# Patient Record
Sex: Male | Born: 1955 | Race: Black or African American | Hispanic: No | Marital: Single | Smoking: Former smoker
Health system: Southern US, Community
[De-identification: ages and names within clinical notes are randomized; demographics above are authoritative.]

## PROBLEM LIST (undated history)

## (undated) DIAGNOSIS — N189 Chronic kidney disease, unspecified: Secondary | ICD-10-CM

## (undated) DIAGNOSIS — H543 Unqualified visual loss, both eyes: Secondary | ICD-10-CM

## (undated) DIAGNOSIS — I10 Essential (primary) hypertension: Secondary | ICD-10-CM

## (undated) DIAGNOSIS — E1129 Type 2 diabetes mellitus with other diabetic kidney complication: Secondary | ICD-10-CM

## (undated) DIAGNOSIS — I150 Renovascular hypertension: Secondary | ICD-10-CM

## (undated) DIAGNOSIS — K922 Gastrointestinal hemorrhage, unspecified: Secondary | ICD-10-CM

## (undated) DIAGNOSIS — N289 Disorder of kidney and ureter, unspecified: Secondary | ICD-10-CM

## (undated) DIAGNOSIS — E785 Hyperlipidemia, unspecified: Secondary | ICD-10-CM

## (undated) DIAGNOSIS — D631 Anemia in chronic kidney disease: Secondary | ICD-10-CM

## (undated) DIAGNOSIS — K219 Gastro-esophageal reflux disease without esophagitis: Secondary | ICD-10-CM

## (undated) DIAGNOSIS — N039 Chronic nephritic syndrome with unspecified morphologic changes: Secondary | ICD-10-CM

## (undated) DIAGNOSIS — D649 Anemia, unspecified: Secondary | ICD-10-CM

## (undated) DIAGNOSIS — E7800 Pure hypercholesterolemia, unspecified: Secondary | ICD-10-CM

## (undated) HISTORY — DX: Disorders of magnesium metabolism, unspecified: E83.40

## (undated) HISTORY — DX: Chronic nephritic syndrome with unspecified morphologic changes: N03.9

## (undated) HISTORY — DX: Chronic kidney disease, unspecified: N18.9

## (undated) HISTORY — DX: Anemia in chronic kidney disease: D63.1

## (undated) HISTORY — DX: Pure hypercholesterolemia, unspecified: E78.00

## (undated) HISTORY — DX: Renovascular hypertension: I15.0

## (undated) HISTORY — DX: Type 2 diabetes mellitus with other diabetic kidney complication: E11.29

## (undated) HISTORY — DX: Gastro-esophageal reflux disease without esophagitis: K21.9

---

## 2004-01-25 ENCOUNTER — Ambulatory Visit: Payer: Self-pay | Admitting: Infectious Diseases

## 2004-01-25 ENCOUNTER — Inpatient Hospital Stay (HOSPITAL_COMMUNITY): Admission: EM | Admit: 2004-01-25 | Discharge: 2004-02-01 | Payer: Self-pay | Admitting: Emergency Medicine

## 2004-01-28 ENCOUNTER — Encounter (INDEPENDENT_AMBULATORY_CARE_PROVIDER_SITE_OTHER): Payer: Self-pay | Admitting: *Deleted

## 2004-04-03 ENCOUNTER — Encounter (INDEPENDENT_AMBULATORY_CARE_PROVIDER_SITE_OTHER): Payer: Self-pay | Admitting: *Deleted

## 2004-04-03 ENCOUNTER — Inpatient Hospital Stay (HOSPITAL_COMMUNITY): Admission: AD | Admit: 2004-04-03 | Discharge: 2004-04-05 | Payer: Self-pay | Admitting: Orthopedic Surgery

## 2004-07-06 ENCOUNTER — Emergency Department (HOSPITAL_COMMUNITY): Admission: EM | Admit: 2004-07-06 | Discharge: 2004-07-06 | Payer: Self-pay | Admitting: Emergency Medicine

## 2006-03-02 ENCOUNTER — Ambulatory Visit: Payer: Self-pay | Admitting: Hospitalist

## 2006-03-02 ENCOUNTER — Inpatient Hospital Stay (HOSPITAL_COMMUNITY): Admission: EM | Admit: 2006-03-02 | Discharge: 2006-03-15 | Payer: Self-pay | Admitting: Emergency Medicine

## 2006-03-04 ENCOUNTER — Encounter: Payer: Self-pay | Admitting: Vascular Surgery

## 2006-03-07 ENCOUNTER — Encounter (INDEPENDENT_AMBULATORY_CARE_PROVIDER_SITE_OTHER): Payer: Self-pay | Admitting: Specialist

## 2006-03-12 ENCOUNTER — Encounter: Payer: Self-pay | Admitting: Vascular Surgery

## 2006-06-07 ENCOUNTER — Ambulatory Visit: Payer: Self-pay | Admitting: Vascular Surgery

## 2006-06-18 ENCOUNTER — Encounter (INDEPENDENT_AMBULATORY_CARE_PROVIDER_SITE_OTHER): Payer: Self-pay | Admitting: Internal Medicine

## 2006-08-28 ENCOUNTER — Telehealth (INDEPENDENT_AMBULATORY_CARE_PROVIDER_SITE_OTHER): Payer: Self-pay | Admitting: *Deleted

## 2007-01-07 ENCOUNTER — Ambulatory Visit: Payer: Self-pay | Admitting: Vascular Surgery

## 2007-06-24 ENCOUNTER — Ambulatory Visit: Payer: Self-pay | Admitting: Vascular Surgery

## 2007-12-23 ENCOUNTER — Ambulatory Visit: Payer: Self-pay | Admitting: Vascular Surgery

## 2008-04-10 ENCOUNTER — Inpatient Hospital Stay (HOSPITAL_COMMUNITY): Admission: EM | Admit: 2008-04-10 | Discharge: 2008-04-13 | Payer: Self-pay | Admitting: Emergency Medicine

## 2008-06-22 ENCOUNTER — Ambulatory Visit: Payer: Self-pay | Admitting: Vascular Surgery

## 2008-07-09 ENCOUNTER — Emergency Department (HOSPITAL_COMMUNITY): Admission: EM | Admit: 2008-07-09 | Discharge: 2008-07-09 | Payer: Self-pay | Admitting: Emergency Medicine

## 2009-02-09 ENCOUNTER — Ambulatory Visit: Payer: Self-pay | Admitting: Vascular Surgery

## 2009-03-06 ENCOUNTER — Emergency Department (HOSPITAL_COMMUNITY): Admission: EM | Admit: 2009-03-06 | Discharge: 2009-03-06 | Payer: Self-pay | Admitting: Emergency Medicine

## 2009-04-10 ENCOUNTER — Inpatient Hospital Stay (HOSPITAL_COMMUNITY): Admission: EM | Admit: 2009-04-10 | Discharge: 2009-04-19 | Payer: Self-pay | Admitting: Emergency Medicine

## 2009-04-10 ENCOUNTER — Ambulatory Visit: Payer: Self-pay | Admitting: Pulmonary Disease

## 2009-04-11 ENCOUNTER — Ambulatory Visit: Payer: Self-pay | Admitting: Internal Medicine

## 2009-04-12 ENCOUNTER — Encounter (INDEPENDENT_AMBULATORY_CARE_PROVIDER_SITE_OTHER): Payer: Self-pay | Admitting: Internal Medicine

## 2009-04-12 ENCOUNTER — Encounter: Payer: Self-pay | Admitting: Internal Medicine

## 2009-07-03 ENCOUNTER — Inpatient Hospital Stay (HOSPITAL_COMMUNITY): Admission: EM | Admit: 2009-07-03 | Discharge: 2009-07-06 | Payer: Self-pay | Admitting: Emergency Medicine

## 2009-07-03 ENCOUNTER — Ambulatory Visit: Payer: Self-pay | Admitting: Infectious Diseases

## 2009-10-04 ENCOUNTER — Inpatient Hospital Stay (HOSPITAL_COMMUNITY): Admission: EM | Admit: 2009-10-04 | Discharge: 2009-10-13 | Payer: Self-pay | Admitting: Emergency Medicine

## 2009-10-06 ENCOUNTER — Encounter (INDEPENDENT_AMBULATORY_CARE_PROVIDER_SITE_OTHER): Payer: Self-pay | Admitting: Internal Medicine

## 2009-10-12 ENCOUNTER — Ambulatory Visit: Payer: Self-pay | Admitting: Internal Medicine

## 2009-11-24 ENCOUNTER — Ambulatory Visit: Payer: Self-pay | Admitting: Internal Medicine

## 2009-11-24 ENCOUNTER — Inpatient Hospital Stay (HOSPITAL_COMMUNITY): Admission: EM | Admit: 2009-11-24 | Discharge: 2009-11-30 | Payer: Self-pay | Admitting: Emergency Medicine

## 2010-01-09 ENCOUNTER — Encounter (HOSPITAL_BASED_OUTPATIENT_CLINIC_OR_DEPARTMENT_OTHER): Admission: RE | Admit: 2010-01-09 | Discharge: 2010-01-27 | Payer: Self-pay | Admitting: General Surgery

## 2010-01-30 ENCOUNTER — Inpatient Hospital Stay (HOSPITAL_COMMUNITY)
Admission: RE | Admit: 2010-01-30 | Discharge: 2010-02-01 | Payer: Self-pay | Source: Home / Self Care | Admitting: Orthopaedic Surgery

## 2010-01-30 ENCOUNTER — Encounter (INDEPENDENT_AMBULATORY_CARE_PROVIDER_SITE_OTHER): Payer: Self-pay | Admitting: Orthopaedic Surgery

## 2010-02-28 ENCOUNTER — Inpatient Hospital Stay (HOSPITAL_COMMUNITY): Admission: EM | Admit: 2010-02-28 | Discharge: 2010-03-08 | Payer: Self-pay | Source: Home / Self Care

## 2010-03-21 ENCOUNTER — Ambulatory Visit: Admit: 2010-03-21 | Payer: Self-pay | Admitting: Vascular Surgery

## 2010-03-21 ENCOUNTER — Ambulatory Visit
Admission: RE | Admit: 2010-03-21 | Discharge: 2010-03-21 | Payer: Self-pay | Source: Home / Self Care | Attending: Vascular Surgery | Admitting: Vascular Surgery

## 2010-04-11 NOTE — Procedures (Signed)
Summary: Upper Endoscopy  Patient: Louis English Note: All result statuses are Final unless otherwise noted.  Tests: (1) Upper Endoscopy (EGD)   EGD Upper Endoscopy       DONE     [facility]     [address]     [place], [place]  [address]           ENDOSCOPY PROCEDURE REPORT           PATIENT:  Louis, English  MR#:  [id number]     BIRTHDATE:  June 06, 1955, 53 yrs. old  GENDER:  male           ENDOSCOPIST:  Hedwig Morton. Juanda Chance, MD     Referred by:  Yetta Barre, M.D.     Coralyn Helling, M.D.           PROCEDURE DATE:  04/12/2009     PROCEDURE:  EGD with biopsy     ASA CLASS:  Class III     INDICATIONS:  nausea and vomiting, hematemesis Right pleural     effusion, diabetes with known gastroparesis           MEDICATIONS:   Versed 7.5 mg, Fentanyl 80 mcg     TOPICAL ANESTHETIC:  Cetacaine Spray           DESCRIPTION OF PROCEDURE:   After the risks benefits and     alternatives of the procedure were thoroughly explained, informed     consent was obtained.  The EG-[id number] ([id number]) endoscope was     introduced through the mouth and advanced to the second portion of     the duodenum, without limitations.  The instrument was slowly     withdrawn as the mucosa was fully examined.     <<PROCEDUREIMAGES>>           Esophagitis was found in the distal esophagus (see image006).     Grade 3 esophagitis, contact bleeding and erosions, no stricture     Mild gastritis was found. minimal prepyloris erythema, patent     pyloric outlet, no gastric retention A biopsy for H. pylori was     taken (see image004, image005, and image002).  The duodenal bulb     was normal in appearance, as was the postbulbar duodenum (see     image003).    Retroflexed views revealed no abnormalities.    The     scope was then withdrawn from the patient and the procedure     completed.           COMPLICATIONS:  None           ENDOSCOPIC IMPRESSION:     1) Esophagitis in the distal esophagus     2) Mild gastritis     3) Normal duodenum     hematemesis likely from esophagitis,     RECOMMENDATIONS:     1) await biopsy results     see chart for plan of treatment           REPEAT EXAM:  In 0 year(s) for.           ______________________________     Hedwig Morton. Juanda Chance, MD           CC:           n.     eSIGNED:   Hedwig Morton. Nygel Prokop at 04/12/2009 03:11 PM           Louis English, [id number]  Note: An exclamation mark Marland Kitchen)  indicates a result that was not dispersed into the flowsheet. Document Creation Date: 04/12/2009 3:12 PM _______________________________________________________________________  (1) Order result status: Final Collection or observation date-time: 04/12/2009 14:59 Requested date-time:  Receipt date-time:  Reported date-time:  Referring Physician:   Ordering Physician: Lina Sar [phone]) Specimen Source:  Source: Launa Grill Order Number: [id number] Lab site:

## 2010-05-03 IMAGING — US US ABDOMEN COMPLETE
1 series · 14 of 25 positions shown · non-contrast
Comparison: Renal ultrasound 04/09/2008

CLINICAL DATA: Nausea and vomiting/acute renal failure

COMPLETE ABDOMINAL ULTRASOUND

[Series 1: us abdomen complete · 0.32mm/px · 14 of 63 slices shown]
[im 1/63]
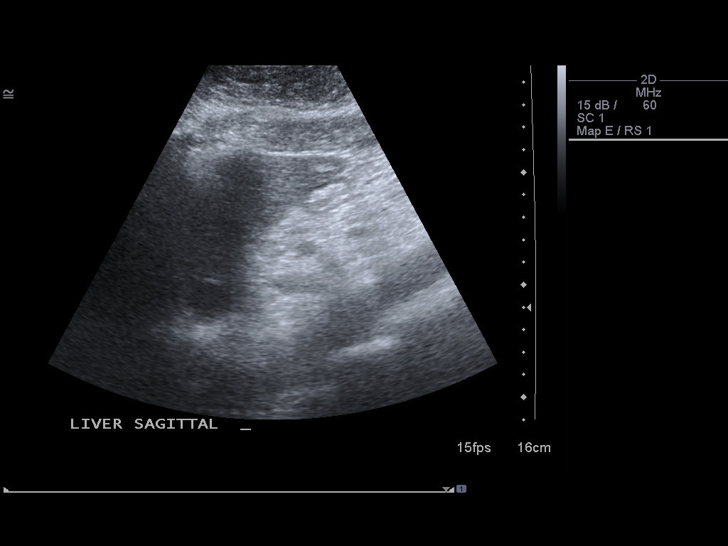
[im 6/63]
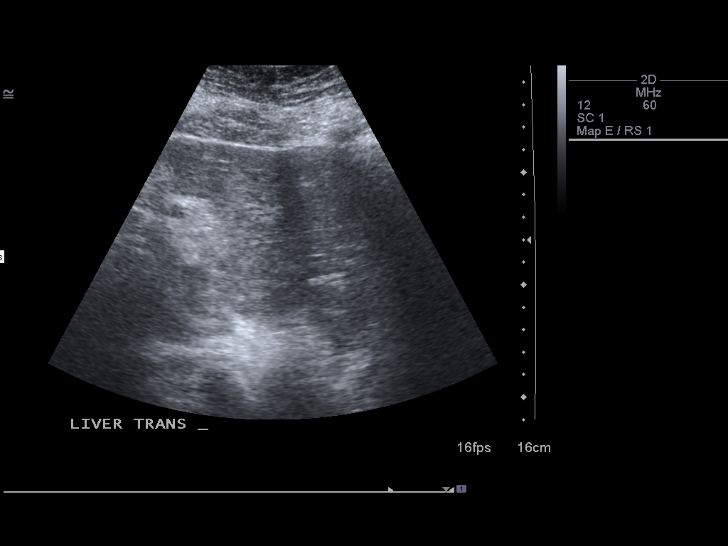
[im 11/63]
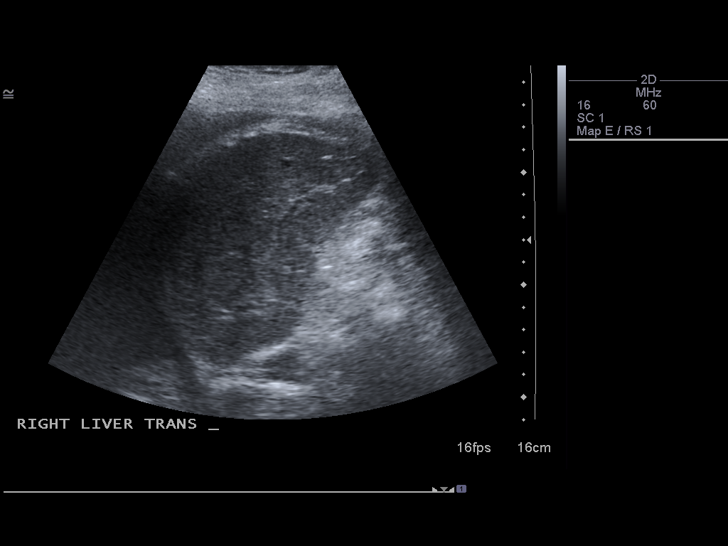
[im 16/63]
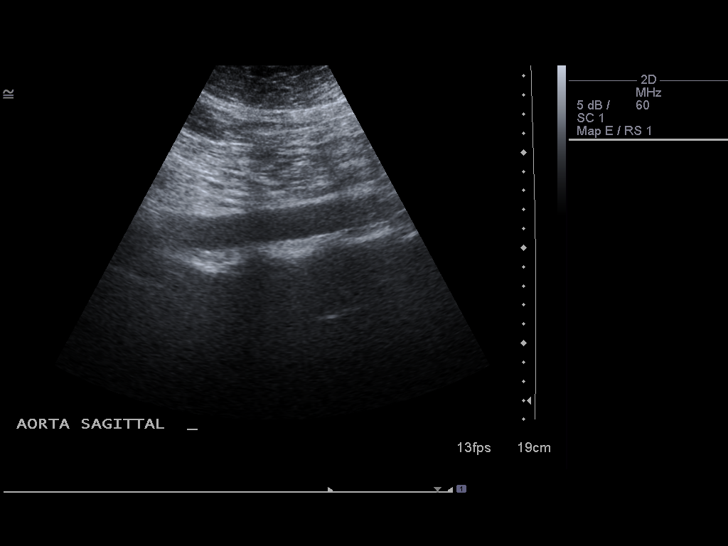
[im 21/63]
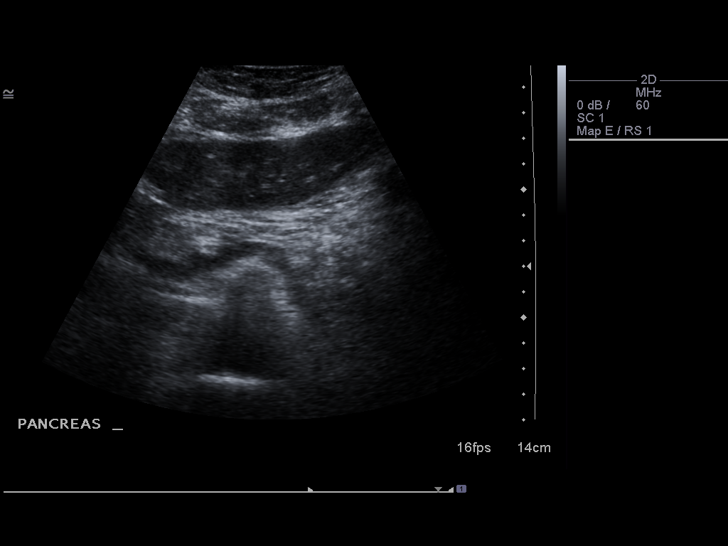
[im 24/63]
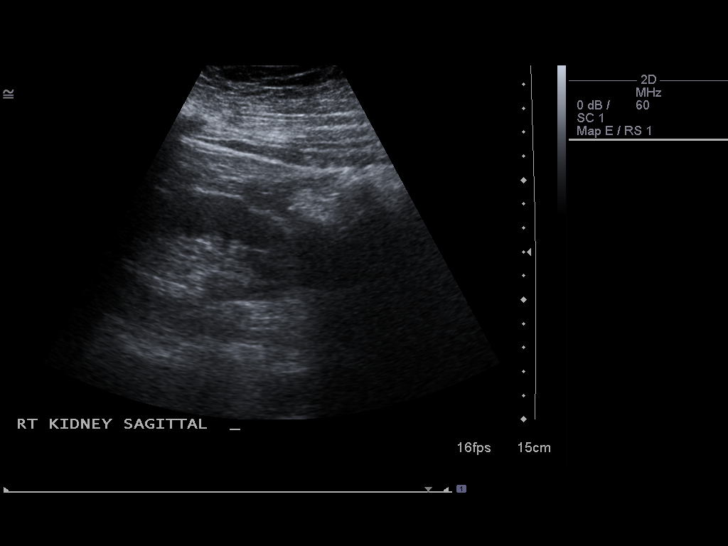
[im 29/63]
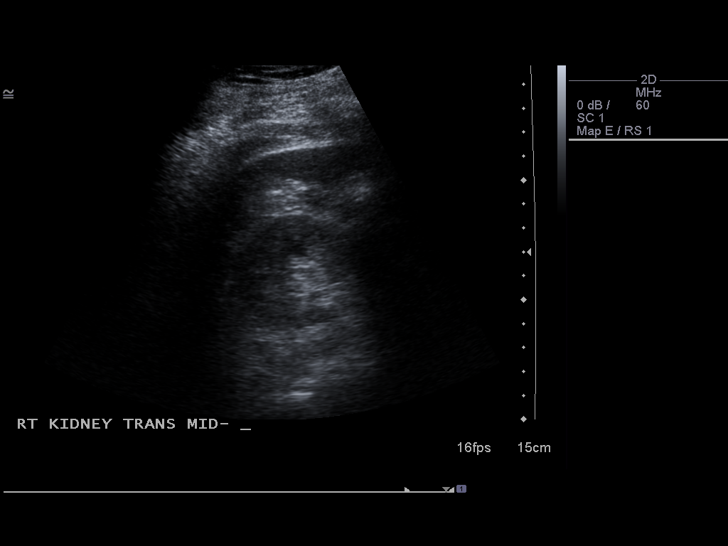
[im 34/63]
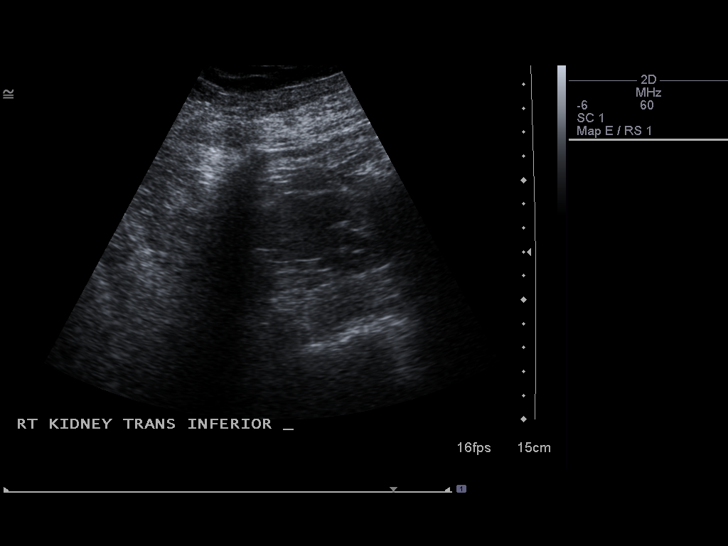
[im 39/63]
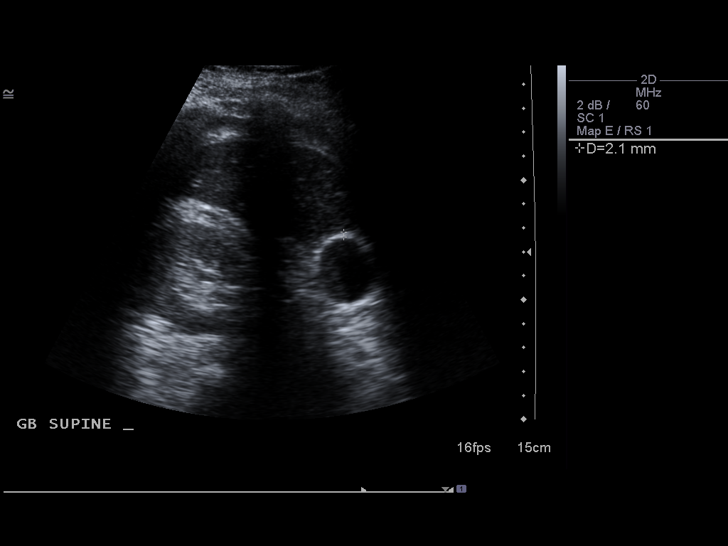
[im 42/63]
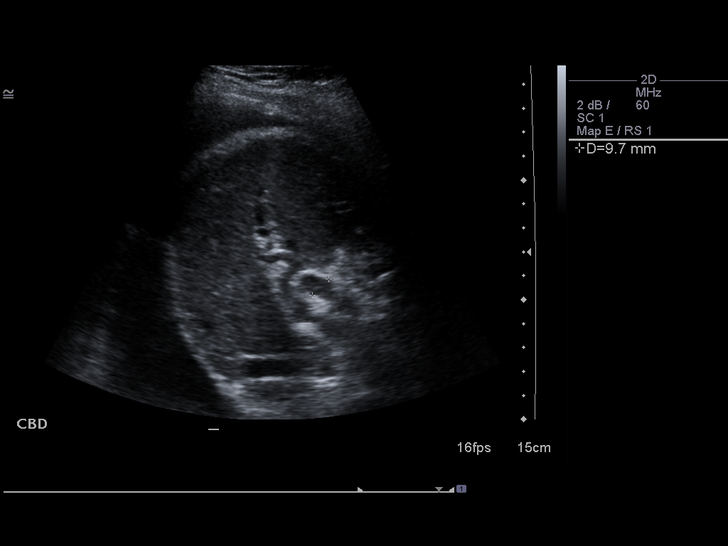
[im 47/63]
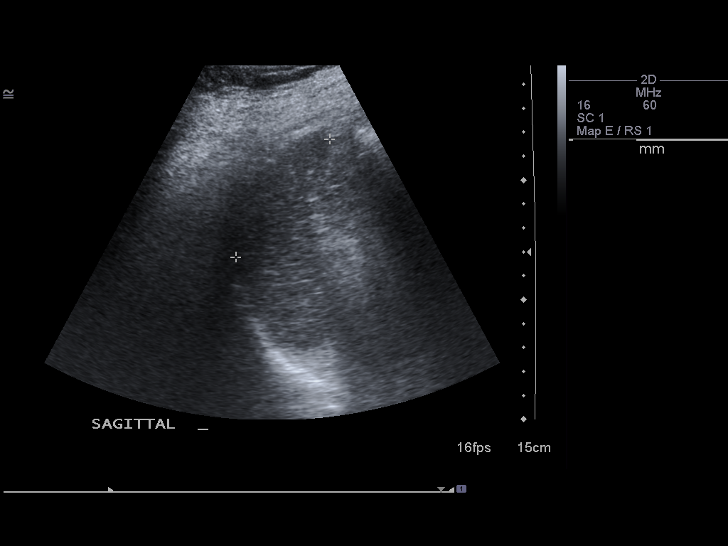
[im 52/63]
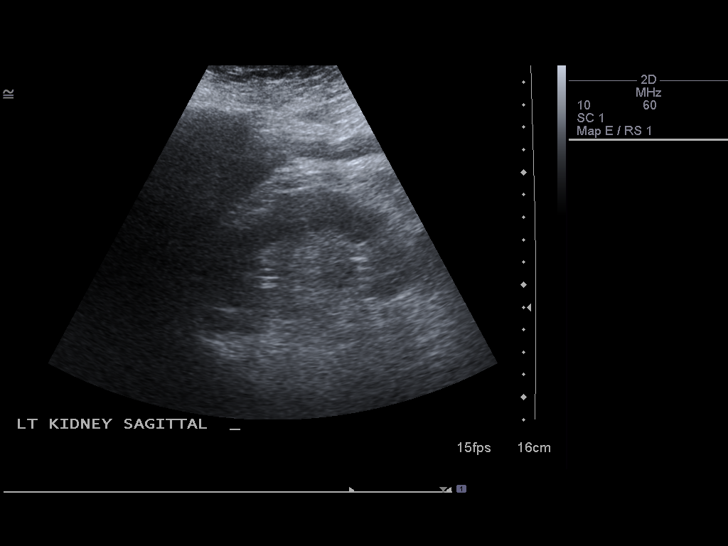
[im 57/63]
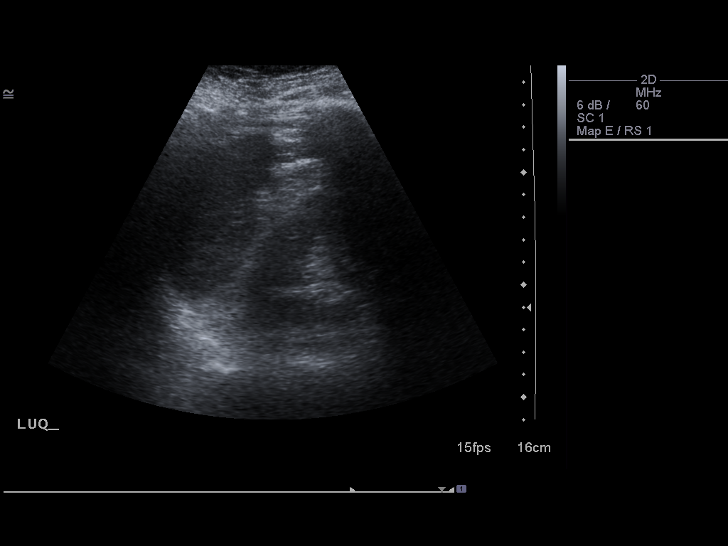
[im 63/63]
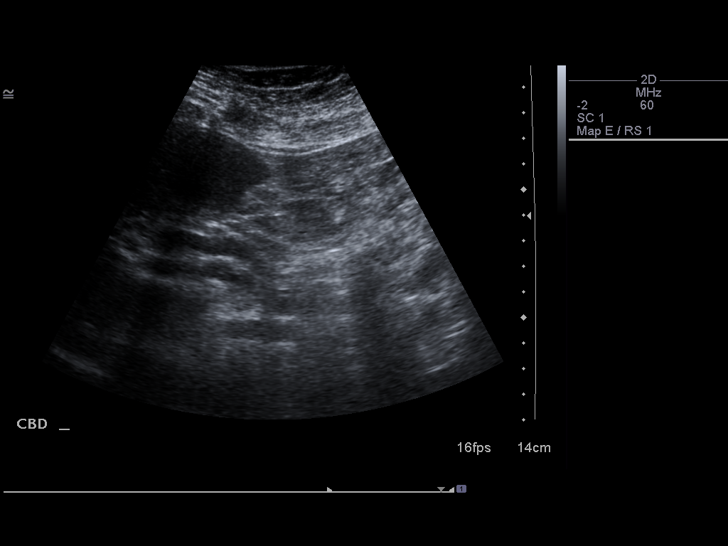

[14 of 25 positions shown; findings below may reference images not displayed]

FINDINGS: Gallbladder:  There is biliary sludge.  No definite stones, wall
thickening, or pericholecystic fluid.  This can be seen normally in
patients who have been fasting for a prolonged periods of time.

Common bile duct:  Dilated at to 1 cm.  No obvious intraluminal
stones or mass.

Liver:  No focal lesions.

IVC:  Appears normal.

Pancreas:  No focal abnormality seen.

Spleen:  6.3 cm.  No lesions

Right Kidney:  11.1 cm in length.  There are several small cysts.
No hydronephrosis.

Left Kidney:  10.8 cm in length.  No hydronephrosis or focal
lesions.

Abdominal aorta:  Normal

A right pleural effusion is incidentally noted.
IMPRESSION: 1.  Biliary sludge without stones.
2.  Common bile duct dilated without stones or obstructing lesions
identified.  This needs clinical correlation.
3.  Normal renal size without hydronephrosis.
4.  A right pleural effusion is incidentally noted.

## 2010-05-05 IMAGING — CR DG CHEST 2V
1 series · 1 of 1 positions shown · non-contrast
Comparison: 04/12/2009

CLINICAL DATA: Cough, shortness of breath, follow up effusion.

CHEST - 2 VIEW

[view not recorded]
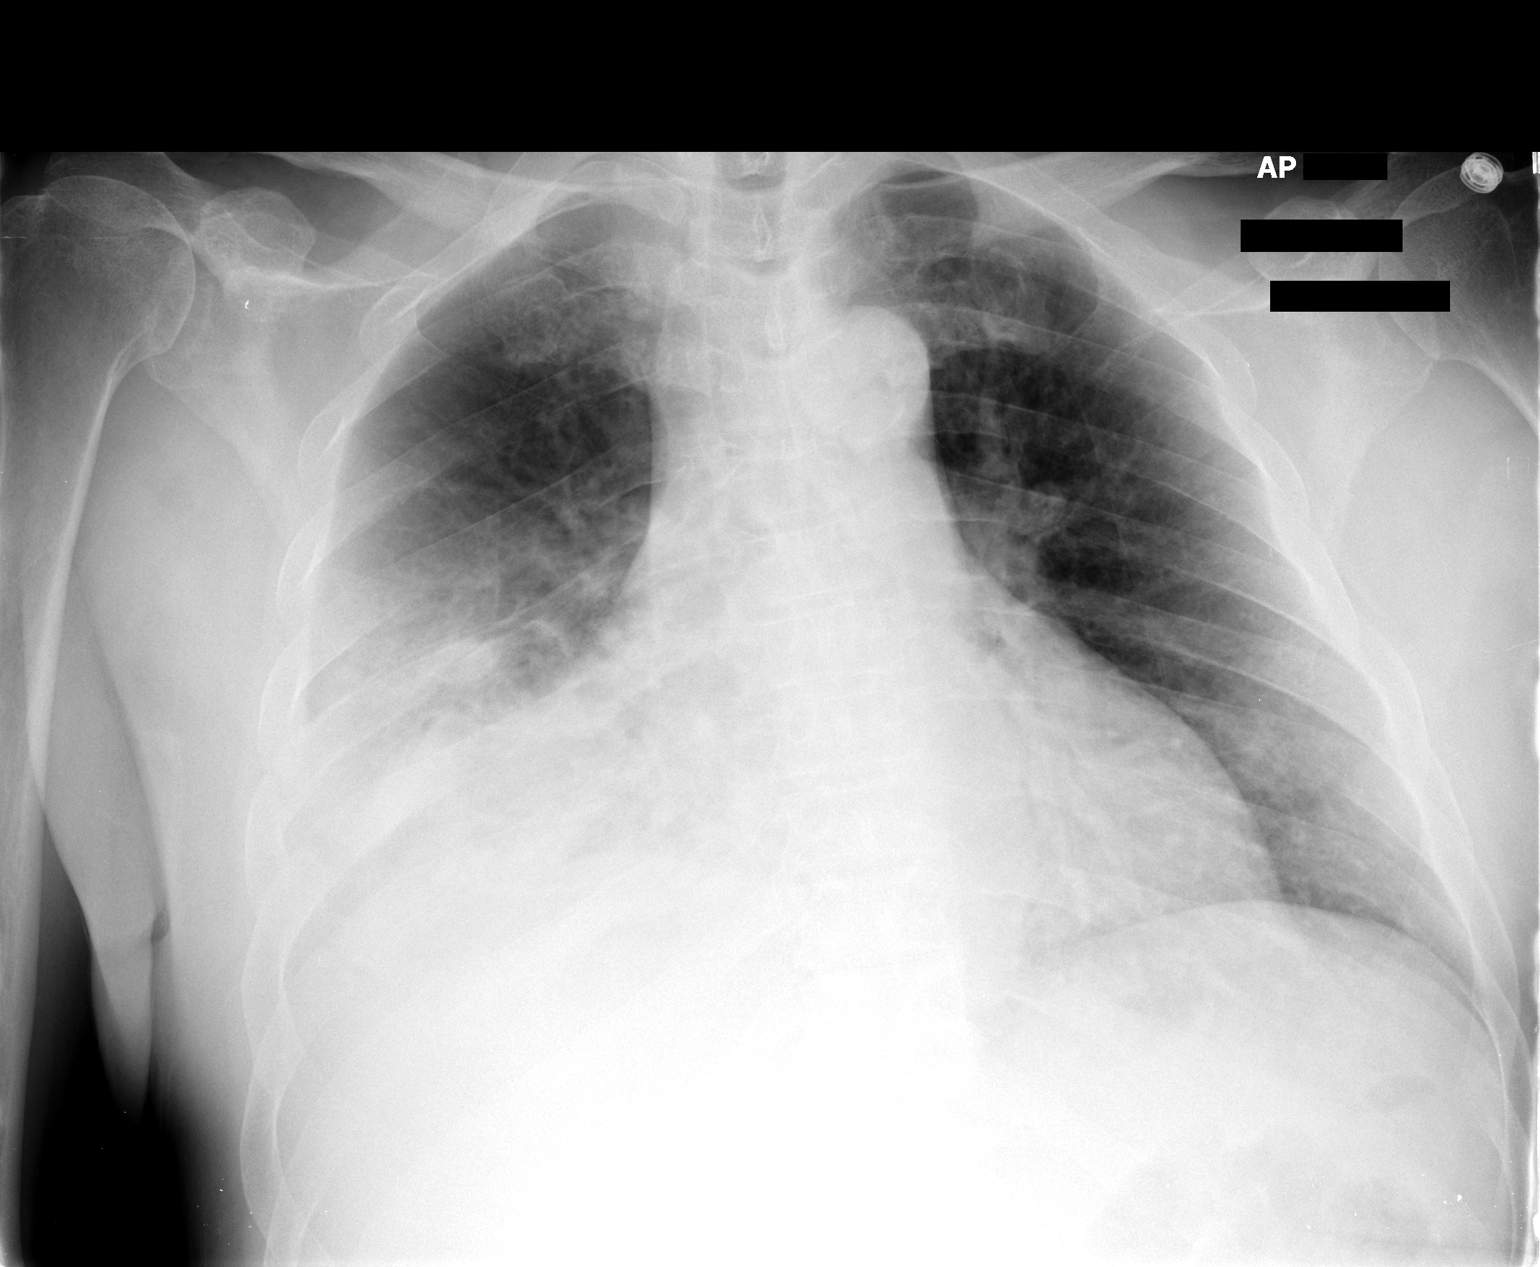

[1 of 1 positions shown; findings below may reference images not displayed]

FINDINGS: Trachea is midline.  Heart size stable.  Right pleural
effusion and right middle and right lower lobe air space disease
persist.  Minimal left lower lobe subsegmental atelectasis.
IMPRESSION: Right pleural effusion and right middle and right lower lobe air
space disease persist.  Findings may be due to pneumonia.  Follow-
up to clearing is recommended.

## 2010-05-05 IMAGING — CR DG CHEST 1V PORT
1 series · 1 of 1 positions shown · non-contrast
Comparison: 04/13/2009 at 1061 hours

CLINICAL DATA: Post right thoracentesis

PORTABLE CHEST - 1 VIEW

[AP]
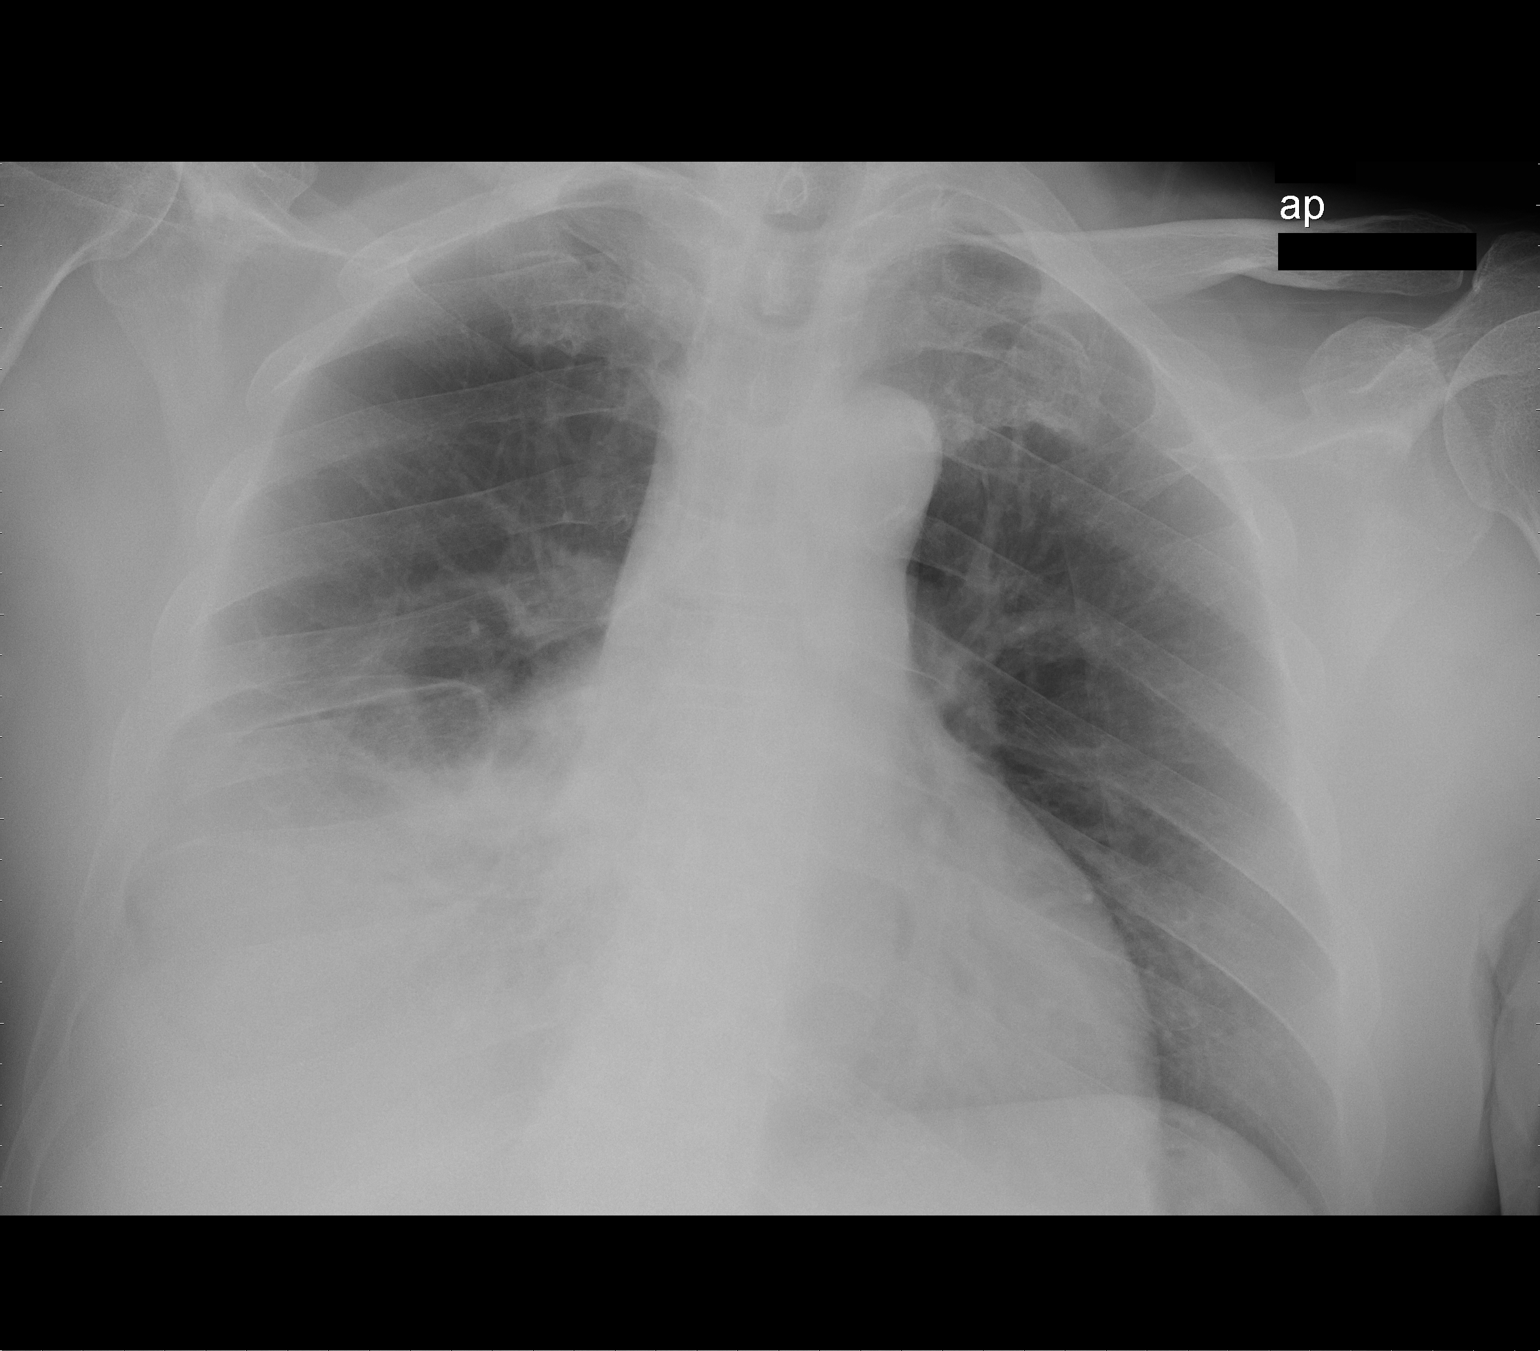

[1 of 1 positions shown; findings below may reference images not displayed]

FINDINGS: No pneumothorax following right thoracentesis.  The right
pleural effusion is somewhat smaller.
IMPRESSION: No pneumothorax following right thoracentesis.

## 2010-05-22 LAB — GLUCOSE, CAPILLARY
Glucose-Capillary: 100 mg/dL — ABNORMAL HIGH (ref 70–99)
Glucose-Capillary: 104 mg/dL — ABNORMAL HIGH (ref 70–99)
Glucose-Capillary: 109 mg/dL — ABNORMAL HIGH (ref 70–99)
Glucose-Capillary: 115 mg/dL — ABNORMAL HIGH (ref 70–99)
Glucose-Capillary: 120 mg/dL — ABNORMAL HIGH (ref 70–99)
Glucose-Capillary: 138 mg/dL — ABNORMAL HIGH (ref 70–99)
Glucose-Capillary: 146 mg/dL — ABNORMAL HIGH (ref 70–99)
Glucose-Capillary: 164 mg/dL — ABNORMAL HIGH (ref 70–99)
Glucose-Capillary: 170 mg/dL — ABNORMAL HIGH (ref 70–99)
Glucose-Capillary: 180 mg/dL — ABNORMAL HIGH (ref 70–99)
Glucose-Capillary: 180 mg/dL — ABNORMAL HIGH (ref 70–99)
Glucose-Capillary: 184 mg/dL — ABNORMAL HIGH (ref 70–99)
Glucose-Capillary: 192 mg/dL — ABNORMAL HIGH (ref 70–99)
Glucose-Capillary: 195 mg/dL — ABNORMAL HIGH (ref 70–99)
Glucose-Capillary: 196 mg/dL — ABNORMAL HIGH (ref 70–99)
Glucose-Capillary: 210 mg/dL — ABNORMAL HIGH (ref 70–99)
Glucose-Capillary: 215 mg/dL — ABNORMAL HIGH (ref 70–99)
Glucose-Capillary: 223 mg/dL — ABNORMAL HIGH (ref 70–99)
Glucose-Capillary: 264 mg/dL — ABNORMAL HIGH (ref 70–99)
Glucose-Capillary: 291 mg/dL — ABNORMAL HIGH (ref 70–99)
Glucose-Capillary: 98 mg/dL (ref 70–99)

## 2010-05-22 LAB — DIFFERENTIAL
Basophils Absolute: 0 10*3/uL (ref 0.0–0.1)
Basophils Relative: 0 % (ref 0–1)
Eosinophils Absolute: 0 10*3/uL (ref 0.0–0.7)
Eosinophils Absolute: 0 10*3/uL (ref 0.0–0.7)
Eosinophils Relative: 0 % (ref 0–5)
Lymphocytes Relative: 13 % (ref 12–46)
Lymphs Abs: 3.1 10*3/uL (ref 0.7–4.0)
Monocytes Relative: 1 % — ABNORMAL LOW (ref 3–12)
Neutro Abs: 14.7 10*3/uL — ABNORMAL HIGH (ref 1.7–7.7)
Neutrophils Relative %: 82 % — ABNORMAL HIGH (ref 43–77)
Neutrophils Relative %: 91 % — ABNORMAL HIGH (ref 43–77)

## 2010-05-22 LAB — CBC
HCT: 26.7 % — ABNORMAL LOW (ref 39.0–52.0)
HCT: 28.8 % — ABNORMAL LOW (ref 39.0–52.0)
HCT: 29.5 % — ABNORMAL LOW (ref 39.0–52.0)
HCT: 31 % — ABNORMAL LOW (ref 39.0–52.0)
Hemoglobin: 8.7 g/dL — ABNORMAL LOW (ref 13.0–17.0)
Hemoglobin: 8.7 g/dL — ABNORMAL LOW (ref 13.0–17.0)
Hemoglobin: 8.9 g/dL — ABNORMAL LOW (ref 13.0–17.0)
Hemoglobin: 9.3 g/dL — ABNORMAL LOW (ref 13.0–17.0)
MCH: 26.4 pg (ref 26.0–34.0)
MCH: 26.7 pg (ref 26.0–34.0)
MCH: 26.7 pg (ref 26.0–34.0)
MCH: 26.9 pg (ref 26.0–34.0)
MCH: 26.9 pg (ref 26.0–34.0)
MCHC: 32.3 g/dL (ref 30.0–36.0)
MCHC: 32.4 g/dL (ref 30.0–36.0)
MCHC: 32.5 g/dL (ref 30.0–36.0)
MCHC: 32.7 g/dL (ref 30.0–36.0)
MCHC: 32.7 g/dL (ref 30.0–36.0)
MCHC: 32.9 g/dL (ref 30.0–36.0)
MCV: 82.4 fL (ref 78.0–100.0)
MCV: 82.7 fL (ref 78.0–100.0)
MCV: 83.3 fL (ref 78.0–100.0)
Platelets: 268 10*3/uL (ref 150–400)
Platelets: 273 10*3/uL (ref 150–400)
Platelets: 305 10*3/uL (ref 150–400)
Platelets: 325 10*3/uL (ref 150–400)
Platelets: 354 10*3/uL (ref 150–400)
Platelets: 383 10*3/uL (ref 150–400)
RBC: 3.23 MIL/uL — ABNORMAL LOW (ref 4.22–5.81)
RBC: 3.48 MIL/uL — ABNORMAL LOW (ref 4.22–5.81)
RBC: 3.73 MIL/uL — ABNORMAL LOW (ref 4.22–5.81)
RBC: 3.75 MIL/uL — ABNORMAL LOW (ref 4.22–5.81)
RDW: 18.4 % — ABNORMAL HIGH (ref 11.5–15.5)
RDW: 18.5 % — ABNORMAL HIGH (ref 11.5–15.5)
RDW: 18.5 % — ABNORMAL HIGH (ref 11.5–15.5)
RDW: 18.5 % — ABNORMAL HIGH (ref 11.5–15.5)
WBC: 5.2 10*3/uL (ref 4.0–10.5)
WBC: 5.4 10*3/uL (ref 4.0–10.5)

## 2010-05-22 LAB — BASIC METABOLIC PANEL
BUN: 26 mg/dL — ABNORMAL HIGH (ref 6–23)
BUN: 30 mg/dL — ABNORMAL HIGH (ref 6–23)
BUN: 31 mg/dL — ABNORMAL HIGH (ref 6–23)
BUN: 38 mg/dL — ABNORMAL HIGH (ref 6–23)
BUN: 41 mg/dL — ABNORMAL HIGH (ref 6–23)
CO2: 24 mEq/L (ref 19–32)
CO2: 26 mEq/L (ref 19–32)
CO2: 27 mEq/L (ref 19–32)
CO2: 27 mEq/L (ref 19–32)
CO2: 29 mEq/L (ref 19–32)
Calcium: 8.7 mg/dL (ref 8.4–10.5)
Calcium: 9 mg/dL (ref 8.4–10.5)
Calcium: 9.1 mg/dL (ref 8.4–10.5)
Calcium: 9.1 mg/dL (ref 8.4–10.5)
Calcium: 9.1 mg/dL (ref 8.4–10.5)
Chloride: 102 mEq/L (ref 96–112)
Chloride: 106 mEq/L (ref 96–112)
Chloride: 106 mEq/L (ref 96–112)
Chloride: 107 mEq/L (ref 96–112)
Creatinine, Ser: 0.93 mg/dL (ref 0.4–1.5)
Creatinine, Ser: 0.99 mg/dL (ref 0.4–1.5)
Creatinine, Ser: 1.02 mg/dL (ref 0.4–1.5)
Creatinine, Ser: 1.09 mg/dL (ref 0.4–1.5)
Creatinine, Ser: 1.36 mg/dL (ref 0.4–1.5)
GFR calc Af Amer: >60 mL/min (ref >60)
GFR calc Af Amer: >60 mL/min (ref >60)
GFR calc Af Amer: >60 mL/min (ref >60)
GFR calc Af Amer: >60 mL/min (ref >60)
GFR calc Af Amer: >60 mL/min (ref >60)
GFR calc Af Amer: >60 mL/min (ref >60)
GFR calc non Af Amer: >60 mL/min (ref >60)
GFR calc non Af Amer: >60 mL/min (ref >60)
GFR calc non Af Amer: >60 mL/min (ref >60)
GFR calc non Af Amer: >60 mL/min (ref >60)
GFR calc non Af Amer: >60 mL/min (ref >60)
Glucose, Bld: 106 mg/dL — ABNORMAL HIGH (ref 70–99)
Glucose, Bld: 122 mg/dL — ABNORMAL HIGH (ref 70–99)
Glucose, Bld: 192 mg/dL — ABNORMAL HIGH (ref 70–99)
Glucose, Bld: 199 mg/dL — ABNORMAL HIGH (ref 70–99)
Glucose, Bld: 209 mg/dL — ABNORMAL HIGH (ref 70–99)
Potassium: 3.9 mEq/L (ref 3.5–5.1)
Potassium: 4 mEq/L (ref 3.5–5.1)
Potassium: 4.3 mEq/L (ref 3.5–5.1)
Potassium: 4.7 mEq/L (ref 3.5–5.1)
Sodium: 136 mEq/L (ref 135–145)
Sodium: 136 mEq/L (ref 135–145)
Sodium: 137 mEq/L (ref 135–145)
Sodium: 137 mEq/L (ref 135–145)

## 2010-05-22 LAB — TSH: TSH: 1.54 u[IU]/mL (ref 0.350–4.500)

## 2010-05-22 LAB — HEMOGLOBIN A1C
Hgb A1c MFr Bld: 6.8 % — ABNORMAL HIGH (ref <5.7)
Mean Plasma Glucose: 148 mg/dL — ABNORMAL HIGH (ref <117)

## 2010-05-22 LAB — URINE CULTURE: Colony Count: >=100000

## 2010-05-22 LAB — URINALYSIS, ROUTINE W REFLEX MICROSCOPIC
Bilirubin Urine: NEGATIVE
Glucose, UA: NEGATIVE mg/dL
Ketones, ur: NEGATIVE mg/dL
Specific Gravity, Urine: 1.015 (ref 1.005–1.030)
Urobilinogen, UA: 0.2 mg/dL (ref 0.0–1.0)
pH: 7 (ref 5.0–8.0)

## 2010-05-22 LAB — MRSA PCR SCREENING: MRSA by PCR: NEGATIVE

## 2010-05-22 LAB — IRON AND TIBC
Saturation Ratios: 24 % (ref 20–55)
UIBC: 182 ug/dL

## 2010-05-22 LAB — URINE MICROSCOPIC-ADD ON

## 2010-05-22 LAB — VITAMIN B12: Vitamin B-12: 705 pg/mL (ref 211–911)

## 2010-05-23 LAB — BASIC METABOLIC PANEL
BUN: 42 mg/dL — ABNORMAL HIGH (ref 6–23)
CO2: 27 mEq/L (ref 19–32)
Calcium: 8 mg/dL — ABNORMAL LOW (ref 8.4–10.5)
Calcium: 9 mg/dL (ref 8.4–10.5)
Chloride: 98 mEq/L (ref 96–112)
Creatinine, Ser: 1.21 mg/dL (ref 0.4–1.5)
Creatinine, Ser: 1.61 mg/dL — ABNORMAL HIGH (ref 0.4–1.5)
Creatinine, Ser: 1.92 mg/dL — ABNORMAL HIGH (ref 0.4–1.5)
GFR calc Af Amer: 54 mL/min — ABNORMAL LOW (ref >60)
GFR calc Af Amer: >60 mL/min (ref >60)
GFR calc non Af Amer: 37 mL/min — ABNORMAL LOW (ref >60)
GFR calc non Af Amer: >60 mL/min (ref >60)
Glucose, Bld: 140 mg/dL — ABNORMAL HIGH (ref 70–99)
Potassium: 4.5 mEq/L (ref 3.5–5.1)
Sodium: 134 mEq/L — ABNORMAL LOW (ref 135–145)

## 2010-05-23 LAB — CBC
HCT: 21.1 % — ABNORMAL LOW (ref 39.0–52.0)
HCT: 26.6 % — ABNORMAL LOW (ref 39.0–52.0)
Hemoglobin: 9 g/dL — ABNORMAL LOW (ref 13.0–17.0)
MCH: 27.3 pg (ref 26.0–34.0)
MCH: 27.9 pg (ref 26.0–34.0)
MCHC: 33.2 g/dL (ref 30.0–36.0)
MCHC: 33.5 g/dL (ref 30.0–36.0)
MCV: 82.4 fL (ref 78.0–100.0)
Platelets: 324 10*3/uL (ref 150–400)
Platelets: 361 10*3/uL (ref 150–400)
Platelets: 375 10*3/uL (ref 150–400)
Platelets: 436 10*3/uL — ABNORMAL HIGH (ref 150–400)
RBC: 3 MIL/uL — ABNORMAL LOW (ref 4.22–5.81)
RBC: 3.23 MIL/uL — ABNORMAL LOW (ref 4.22–5.81)
RBC: 3.23 MIL/uL — ABNORMAL LOW (ref 4.22–5.81)
RDW: 15.2 % (ref 11.5–15.5)
WBC: 11 10*3/uL — ABNORMAL HIGH (ref 4.0–10.5)
WBC: 15.4 10*3/uL — ABNORMAL HIGH (ref 4.0–10.5)

## 2010-05-23 LAB — DIFFERENTIAL
Basophils Absolute: 0 10*3/uL (ref 0.0–0.1)
Eosinophils Absolute: 0 10*3/uL (ref 0.0–0.7)
Lymphocytes Relative: 3 % — ABNORMAL LOW (ref 12–46)
Neutro Abs: 28.8 10*3/uL — ABNORMAL HIGH (ref 1.7–7.7)
Neutrophils Relative %: 92 % — ABNORMAL HIGH (ref 43–77)

## 2010-05-23 LAB — GLUCOSE, CAPILLARY
Glucose-Capillary: 134 mg/dL — ABNORMAL HIGH (ref 70–99)
Glucose-Capillary: 155 mg/dL — ABNORMAL HIGH (ref 70–99)
Glucose-Capillary: 69 mg/dL — ABNORMAL LOW (ref 70–99)
Glucose-Capillary: 73 mg/dL (ref 70–99)
Glucose-Capillary: 79 mg/dL (ref 70–99)

## 2010-05-23 LAB — CROSSMATCH
ABO/RH(D): O NEG
Unit division: 0

## 2010-05-23 LAB — COMPREHENSIVE METABOLIC PANEL
AST: 16 U/L (ref 0–37)
Albumin: 1.6 g/dL — ABNORMAL LOW (ref 3.5–5.2)
Calcium: 8.3 mg/dL — ABNORMAL LOW (ref 8.4–10.5)
Creatinine, Ser: 1.78 mg/dL — ABNORMAL HIGH (ref 0.4–1.5)
GFR calc Af Amer: 48 mL/min — ABNORMAL LOW (ref >60)

## 2010-05-23 LAB — PROTIME-INR: INR: 1.4 (ref 0.00–1.49)

## 2010-05-25 LAB — CBC
Hemoglobin: 7.6 g/dL — ABNORMAL LOW (ref 13.0–17.0)
Hemoglobin: 8.2 g/dL — ABNORMAL LOW (ref 13.0–17.0)
MCH: 25.5 pg — ABNORMAL LOW (ref 26.0–34.0)
MCH: 25.9 pg — ABNORMAL LOW (ref 26.0–34.0)
MCHC: 33.9 g/dL (ref 30.0–36.0)
MCHC: 34.5 g/dL (ref 30.0–36.0)
MCHC: 34.5 g/dL (ref 30.0–36.0)
MCV: 75.5 fL — ABNORMAL LOW (ref 78.0–100.0)
Platelets: 379 10*3/uL (ref 150–400)
Platelets: 387 10*3/uL (ref 150–400)
Platelets: 419 10*3/uL — ABNORMAL HIGH (ref 150–400)
RBC: 2.82 MIL/uL — ABNORMAL LOW (ref 4.22–5.81)
RBC: 2.96 MIL/uL — ABNORMAL LOW (ref 4.22–5.81)
RBC: 3.36 MIL/uL — ABNORMAL LOW (ref 4.22–5.81)
RDW: 15.3 % (ref 11.5–15.5)
RDW: 15.3 % (ref 11.5–15.5)
RDW: 15.5 % (ref 11.5–15.5)
RDW: 15.6 % — ABNORMAL HIGH (ref 11.5–15.5)
WBC: 11 10*3/uL — ABNORMAL HIGH (ref 4.0–10.5)
WBC: 6.6 10*3/uL (ref 4.0–10.5)
WBC: 9.5 10*3/uL (ref 4.0–10.5)

## 2010-05-25 LAB — BASIC METABOLIC PANEL
BUN: 2 mg/dL — ABNORMAL LOW (ref 6–23)
BUN: 2 mg/dL — ABNORMAL LOW (ref 6–23)
BUN: 3 mg/dL — ABNORMAL LOW (ref 6–23)
BUN: 4 mg/dL — ABNORMAL LOW (ref 6–23)
BUN: 5 mg/dL — ABNORMAL LOW (ref 6–23)
CO2: 23 mEq/L (ref 19–32)
CO2: 25 mEq/L (ref 19–32)
CO2: 26 mEq/L (ref 19–32)
Calcium: 7.9 mg/dL — ABNORMAL LOW (ref 8.4–10.5)
Calcium: 7.9 mg/dL — ABNORMAL LOW (ref 8.4–10.5)
Calcium: 8 mg/dL — ABNORMAL LOW (ref 8.4–10.5)
Calcium: 8 mg/dL — ABNORMAL LOW (ref 8.4–10.5)
Chloride: 103 mEq/L (ref 96–112)
Chloride: 103 mEq/L (ref 96–112)
Creatinine, Ser: 0.98 mg/dL (ref 0.4–1.5)
GFR calc Af Amer: >60 mL/min (ref >60)
GFR calc Af Amer: >60 mL/min (ref >60)
GFR calc non Af Amer: >60 mL/min (ref >60)
GFR calc non Af Amer: >60 mL/min (ref >60)
GFR calc non Af Amer: >60 mL/min (ref >60)
Glucose, Bld: 110 mg/dL — ABNORMAL HIGH (ref 70–99)
Glucose, Bld: 135 mg/dL — ABNORMAL HIGH (ref 70–99)
Glucose, Bld: 190 mg/dL — ABNORMAL HIGH (ref 70–99)
Glucose, Bld: 191 mg/dL — ABNORMAL HIGH (ref 70–99)
Potassium: 3.8 mEq/L (ref 3.5–5.1)
Potassium: 4 mEq/L (ref 3.5–5.1)
Sodium: 132 mEq/L — ABNORMAL LOW (ref 135–145)
Sodium: 134 mEq/L — ABNORMAL LOW (ref 135–145)
Sodium: 135 mEq/L (ref 135–145)
Sodium: 138 mEq/L (ref 135–145)

## 2010-05-25 LAB — URINE MICROSCOPIC-ADD ON

## 2010-05-25 LAB — GLUCOSE, CAPILLARY
Glucose-Capillary: 111 mg/dL — ABNORMAL HIGH (ref 70–99)
Glucose-Capillary: 112 mg/dL — ABNORMAL HIGH (ref 70–99)
Glucose-Capillary: 134 mg/dL — ABNORMAL HIGH (ref 70–99)
Glucose-Capillary: 136 mg/dL — ABNORMAL HIGH (ref 70–99)
Glucose-Capillary: 149 mg/dL — ABNORMAL HIGH (ref 70–99)
Glucose-Capillary: 151 mg/dL — ABNORMAL HIGH (ref 70–99)
Glucose-Capillary: 153 mg/dL — ABNORMAL HIGH (ref 70–99)
Glucose-Capillary: 155 mg/dL — ABNORMAL HIGH (ref 70–99)
Glucose-Capillary: 168 mg/dL — ABNORMAL HIGH (ref 70–99)
Glucose-Capillary: 183 mg/dL — ABNORMAL HIGH (ref 70–99)
Glucose-Capillary: 184 mg/dL — ABNORMAL HIGH (ref 70–99)
Glucose-Capillary: 195 mg/dL — ABNORMAL HIGH (ref 70–99)
Glucose-Capillary: 202 mg/dL — ABNORMAL HIGH (ref 70–99)
Glucose-Capillary: 213 mg/dL — ABNORMAL HIGH (ref 70–99)
Glucose-Capillary: 258 mg/dL — ABNORMAL HIGH (ref 70–99)
Glucose-Capillary: 267 mg/dL — ABNORMAL HIGH (ref 70–99)
Glucose-Capillary: 314 mg/dL — ABNORMAL HIGH (ref 70–99)
Glucose-Capillary: 98 mg/dL (ref 70–99)

## 2010-05-25 LAB — TYPE AND SCREEN

## 2010-05-25 LAB — COMPREHENSIVE METABOLIC PANEL
AST: 15 U/L (ref 0–37)
Albumin: 2.3 g/dL — ABNORMAL LOW (ref 3.5–5.2)
Alkaline Phosphatase: 73 U/L (ref 39–117)
BUN: 9 mg/dL (ref 6–23)
Chloride: 104 mEq/L (ref 96–112)
GFR calc Af Amer: >60 mL/min (ref >60)
Potassium: 2.9 mEq/L — ABNORMAL LOW (ref 3.5–5.1)
Sodium: 136 mEq/L (ref 135–145)
Total Bilirubin: 0.6 mg/dL (ref 0.3–1.2)
Total Protein: 7.1 g/dL (ref 6.0–8.3)

## 2010-05-25 LAB — IRON AND TIBC
Iron: 12 ug/dL — ABNORMAL LOW (ref 42–135)
Saturation Ratios: 12 % — ABNORMAL LOW (ref 20–55)
TIBC: 104 ug/dL — ABNORMAL LOW (ref 215–435)

## 2010-05-25 LAB — RETICULOCYTES: Retic Count, Absolute: 30.3 10*3/uL (ref 19.0–186.0)

## 2010-05-25 LAB — DIFFERENTIAL
Basophils Absolute: 0 10*3/uL (ref 0.0–0.1)
Basophils Relative: 0 % (ref 0–1)
Eosinophils Relative: 1 % (ref 0–5)
Monocytes Absolute: 0.9 10*3/uL (ref 0.1–1.0)
Monocytes Relative: 10 % (ref 3–12)
Neutro Abs: 7.1 10*3/uL (ref 1.7–7.7)

## 2010-05-25 LAB — SEDIMENTATION RATE: Sed Rate: 123 mm/hr — ABNORMAL HIGH (ref 0–16)

## 2010-05-25 LAB — CLOSTRIDIUM DIFFICILE EIA

## 2010-05-25 LAB — URINALYSIS, ROUTINE W REFLEX MICROSCOPIC
Glucose, UA: NEGATIVE mg/dL
Protein, ur: 100 mg/dL — AB
Specific Gravity, Urine: 1.022 (ref 1.005–1.030)

## 2010-05-25 LAB — LIPID PANEL
Cholesterol: 106 mg/dL (ref 0–200)
Total CHOL/HDL Ratio: 3.8 RATIO
VLDL: 14 mg/dL (ref 0–40)

## 2010-05-25 LAB — MAGNESIUM: Magnesium: 1.3 mg/dL — ABNORMAL LOW (ref 1.5–2.5)

## 2010-05-25 LAB — HEMOCCULT GUIAC POC 1CARD (OFFICE): Fecal Occult Bld: POSITIVE

## 2010-05-25 LAB — VITAMIN B12: Vitamin B-12: 555 pg/mL (ref 211–911)

## 2010-05-25 LAB — URINE CULTURE

## 2010-05-25 LAB — MRSA PCR SCREENING: MRSA by PCR: POSITIVE — AB

## 2010-05-26 LAB — BASIC METABOLIC PANEL
BUN: 12 mg/dL (ref 6–23)
CO2: 28 mEq/L (ref 19–32)
CO2: 28 mEq/L (ref 19–32)
Calcium: 8 mg/dL — ABNORMAL LOW (ref 8.4–10.5)
Calcium: 8 mg/dL — ABNORMAL LOW (ref 8.4–10.5)
Chloride: 102 mEq/L (ref 96–112)
Chloride: 107 mEq/L (ref 96–112)
Creatinine, Ser: 1.54 mg/dL — ABNORMAL HIGH (ref 0.4–1.5)
Creatinine, Ser: 1.6 mg/dL — ABNORMAL HIGH (ref 0.4–1.5)
GFR calc Af Amer: 57 mL/min — ABNORMAL LOW (ref >60)
GFR calc non Af Amer: 46 mL/min — ABNORMAL LOW (ref >60)
GFR calc non Af Amer: 47 mL/min — ABNORMAL LOW (ref >60)
Glucose, Bld: 313 mg/dL — ABNORMAL HIGH (ref 70–99)
Potassium: 3.7 mEq/L (ref 3.5–5.1)
Sodium: 139 mEq/L (ref 135–145)
Sodium: 140 mEq/L (ref 135–145)

## 2010-05-26 LAB — GLUCOSE, CAPILLARY
Glucose-Capillary: 100 mg/dL — ABNORMAL HIGH (ref 70–99)
Glucose-Capillary: 162 mg/dL — ABNORMAL HIGH (ref 70–99)
Glucose-Capillary: 188 mg/dL — ABNORMAL HIGH (ref 70–99)
Glucose-Capillary: 188 mg/dL — ABNORMAL HIGH (ref 70–99)
Glucose-Capillary: 201 mg/dL — ABNORMAL HIGH (ref 70–99)
Glucose-Capillary: 214 mg/dL — ABNORMAL HIGH (ref 70–99)
Glucose-Capillary: 277 mg/dL — ABNORMAL HIGH (ref 70–99)
Glucose-Capillary: 54 mg/dL — ABNORMAL LOW (ref 70–99)

## 2010-05-26 LAB — CBC
HCT: 27.2 % — ABNORMAL LOW (ref 39.0–52.0)
Hemoglobin: 8.8 g/dL — ABNORMAL LOW (ref 13.0–17.0)
Hemoglobin: 9.1 g/dL — ABNORMAL LOW (ref 13.0–17.0)
MCH: 27 pg (ref 26.0–34.0)
MCH: 27.2 pg (ref 26.0–34.0)
MCHC: 32.5 g/dL (ref 30.0–36.0)
MCHC: 34.5 g/dL (ref 30.0–36.0)
MCHC: 34.7 g/dL (ref 30.0–36.0)
MCV: 78.1 fL (ref 78.0–100.0)
Platelets: 304 10*3/uL (ref 150–400)
Platelets: 349 10*3/uL (ref 150–400)
RBC: 3.34 MIL/uL — ABNORMAL LOW (ref 4.22–5.81)
RDW: 13.4 % (ref 11.5–15.5)

## 2010-05-26 LAB — CLOSTRIDIUM DIFFICILE EIA
C difficile Toxins A+B, EIA: NEGATIVE
C difficile Toxins A+B, EIA: NEGATIVE
C difficile Toxins A+B, EIA: NEGATIVE

## 2010-05-26 LAB — HEMOCCULT GUIAC POC 1CARD (OFFICE): Fecal Occult Bld: NEGATIVE

## 2010-05-26 LAB — URIC ACID: Uric Acid, Serum: 3.8 mg/dL — ABNORMAL LOW (ref 4.0–7.8)

## 2010-05-26 LAB — HEMOGLOBIN A1C: Mean Plasma Glucose: 157 mg/dL — ABNORMAL HIGH (ref <117)

## 2010-05-27 LAB — URINE MICROSCOPIC-ADD ON

## 2010-05-27 LAB — GLUCOSE, CAPILLARY
Glucose-Capillary: 109 mg/dL — ABNORMAL HIGH (ref 70–99)
Glucose-Capillary: 113 mg/dL — ABNORMAL HIGH (ref 70–99)
Glucose-Capillary: 117 mg/dL — ABNORMAL HIGH (ref 70–99)
Glucose-Capillary: 120 mg/dL — ABNORMAL HIGH (ref 70–99)
Glucose-Capillary: 149 mg/dL — ABNORMAL HIGH (ref 70–99)
Glucose-Capillary: 179 mg/dL — ABNORMAL HIGH (ref 70–99)
Glucose-Capillary: 184 mg/dL — ABNORMAL HIGH (ref 70–99)
Glucose-Capillary: 234 mg/dL — ABNORMAL HIGH (ref 70–99)
Glucose-Capillary: 245 mg/dL — ABNORMAL HIGH (ref 70–99)
Glucose-Capillary: 257 mg/dL — ABNORMAL HIGH (ref 70–99)
Glucose-Capillary: 259 mg/dL — ABNORMAL HIGH (ref 70–99)
Glucose-Capillary: 286 mg/dL — ABNORMAL HIGH (ref 70–99)

## 2010-05-27 LAB — CORTISOL: Cortisol, Plasma: 40.2 ug/dL

## 2010-05-27 LAB — URINALYSIS, ROUTINE W REFLEX MICROSCOPIC
Leukocytes, UA: NEGATIVE
Nitrite: NEGATIVE
Specific Gravity, Urine: 1.023 (ref 1.005–1.030)
Urobilinogen, UA: 1 mg/dL (ref 0.0–1.0)
pH: 5 (ref 5.0–8.0)

## 2010-05-27 LAB — CBC
HCT: 27.4 % — ABNORMAL LOW (ref 39.0–52.0)
HCT: 27.5 % — ABNORMAL LOW (ref 39.0–52.0)
HCT: 28.4 % — ABNORMAL LOW (ref 39.0–52.0)
HCT: 29.9 % — ABNORMAL LOW (ref 39.0–52.0)
HCT: 33 % — ABNORMAL LOW (ref 39.0–52.0)
HCT: 35.2 % — ABNORMAL LOW (ref 39.0–52.0)
Hemoglobin: 11.6 g/dL — ABNORMAL LOW (ref 13.0–17.0)
Hemoglobin: 9.4 g/dL — ABNORMAL LOW (ref 13.0–17.0)
Hemoglobin: 9.9 g/dL — ABNORMAL LOW (ref 13.0–17.0)
MCH: 27.9 pg (ref 26.0–34.0)
MCH: 28.2 pg (ref 26.0–34.0)
MCHC: 32.7 g/dL (ref 30.0–36.0)
MCHC: 32.9 g/dL (ref 30.0–36.0)
MCHC: 33.2 g/dL (ref 30.0–36.0)
MCV: 84.2 fL (ref 78.0–100.0)
MCV: 84.7 fL (ref 78.0–100.0)
MCV: 85 fL (ref 78.0–100.0)
MCV: 85.1 fL (ref 78.0–100.0)
MCV: 85.2 fL (ref 78.0–100.0)
Platelets: 142 10*3/uL — ABNORMAL LOW (ref 150–400)
Platelets: 158 10*3/uL (ref 150–400)
RBC: 3.22 MIL/uL — ABNORMAL LOW (ref 4.22–5.81)
RBC: 3.53 MIL/uL — ABNORMAL LOW (ref 4.22–5.81)
RDW: 14.2 % (ref 11.5–15.5)
RDW: 14.2 % (ref 11.5–15.5)
RDW: 14.3 % (ref 11.5–15.5)
WBC: 14.4 10*3/uL — ABNORMAL HIGH (ref 4.0–10.5)
WBC: 16 10*3/uL — ABNORMAL HIGH (ref 4.0–10.5)
WBC: 23.2 10*3/uL — ABNORMAL HIGH (ref 4.0–10.5)
WBC: 23.6 10*3/uL — ABNORMAL HIGH (ref 4.0–10.5)
WBC: 24.3 10*3/uL — ABNORMAL HIGH (ref 4.0–10.5)

## 2010-05-27 LAB — DIFFERENTIAL
Basophils Absolute: 0 10*3/uL (ref 0.0–0.1)
Basophils Relative: 0 % (ref 0–1)
Basophils Relative: 0 % (ref 0–1)
Eosinophils Absolute: 0 10*3/uL (ref 0.0–0.7)
Lymphocytes Relative: 5 % — ABNORMAL LOW (ref 12–46)
Lymphocytes Relative: 6 % — ABNORMAL LOW (ref 12–46)
Lymphs Abs: 1.2 10*3/uL (ref 0.7–4.0)
Monocytes Relative: 5 % (ref 3–12)
Neutro Abs: 20.8 10*3/uL — ABNORMAL HIGH (ref 1.7–7.7)
Neutrophils Relative %: 84 % — ABNORMAL HIGH (ref 43–77)

## 2010-05-27 LAB — COMPREHENSIVE METABOLIC PANEL
ALT: 38 U/L (ref 0–53)
ALT: 40 U/L (ref 0–53)
AST: 43 U/L — ABNORMAL HIGH (ref 0–37)
Albumin: 2.2 g/dL — ABNORMAL LOW (ref 3.5–5.2)
Alkaline Phosphatase: 67 U/L (ref 39–117)
Alkaline Phosphatase: 84 U/L (ref 39–117)
Alkaline Phosphatase: 89 U/L (ref 39–117)
BUN: 25 mg/dL — ABNORMAL HIGH (ref 6–23)
BUN: 26 mg/dL — ABNORMAL HIGH (ref 6–23)
BUN: 29 mg/dL — ABNORMAL HIGH (ref 6–23)
CO2: 26 mEq/L (ref 19–32)
Calcium: 7.7 mg/dL — ABNORMAL LOW (ref 8.4–10.5)
Chloride: 102 mEq/L (ref 96–112)
GFR calc Af Amer: 58 mL/min — ABNORMAL LOW (ref >60)
GFR calc non Af Amer: 42 mL/min — ABNORMAL LOW (ref >60)
Glucose, Bld: 138 mg/dL — ABNORMAL HIGH (ref 70–99)
Glucose, Bld: 143 mg/dL — ABNORMAL HIGH (ref 70–99)
Glucose, Bld: 151 mg/dL — ABNORMAL HIGH (ref 70–99)
Potassium: 3.1 mEq/L — ABNORMAL LOW (ref 3.5–5.1)
Potassium: 3.2 mEq/L — ABNORMAL LOW (ref 3.5–5.1)
Potassium: 3.5 mEq/L (ref 3.5–5.1)
Sodium: 140 mEq/L (ref 135–145)
Total Bilirubin: 1.2 mg/dL (ref 0.3–1.2)
Total Protein: 5.8 g/dL — ABNORMAL LOW (ref 6.0–8.3)
Total Protein: 6.3 g/dL (ref 6.0–8.3)
Total Protein: 7 g/dL (ref 6.0–8.3)

## 2010-05-27 LAB — HEMOCCULT GUIAC POC 1CARD (OFFICE): Fecal Occult Bld: NEGATIVE

## 2010-05-27 LAB — CSF CELL COUNT WITH DIFFERENTIAL: Tube #: 1

## 2010-05-27 LAB — LACTATE DEHYDROGENASE, PLEURAL OR PERITONEAL FLUID: LD, Fluid: 12 U/L (ref 3–23)

## 2010-05-27 LAB — CULTURE, BLOOD (ROUTINE X 2)
Culture: NO GROWTH
Culture: NO GROWTH

## 2010-05-27 LAB — CARDIAC PANEL(CRET KIN+CKTOT+MB+TROPI)
Relative Index: 1.1 (ref 0.0–2.5)
Total CK: 127 U/L (ref 7–232)
Total CK: 152 U/L (ref 7–232)
Troponin I: 0.04 ng/mL (ref 0.00–0.06)
Troponin I: 0.08 ng/mL — ABNORMAL HIGH (ref 0.00–0.06)

## 2010-05-27 LAB — HERPES SIMPLEX VIRUS(HSV) DNA BY PCR: HSV 1 DNA: NOT DETECTED

## 2010-05-27 LAB — URINE CULTURE: Culture: NO GROWTH

## 2010-05-27 LAB — ALBUMIN, FLUID (OTHER): Albumin, Fluid: <2 g/dL

## 2010-05-27 LAB — BASIC METABOLIC PANEL
Chloride: 107 mEq/L (ref 96–112)
GFR calc Af Amer: >60 mL/min (ref >60)
GFR calc non Af Amer: 50 mL/min — ABNORMAL LOW (ref >60)
Potassium: 3.2 mEq/L — ABNORMAL LOW (ref 3.5–5.1)

## 2010-05-27 LAB — CSF CULTURE W GRAM STAIN: Culture: NO GROWTH

## 2010-05-27 LAB — GLUCOSE, CSF: Glucose, CSF: 67 mg/dL (ref 43–76)

## 2010-05-27 LAB — SEDIMENTATION RATE: Sed Rate: 46 mm/hr — ABNORMAL HIGH (ref 0–16)

## 2010-05-27 LAB — PROTEIN, CSF: Total  Protein, CSF: 37 mg/dL (ref 15–45)

## 2010-05-27 LAB — POCT I-STAT, CHEM 8
Chloride: 100 mEq/L (ref 96–112)
Creatinine, Ser: 2.1 mg/dL — ABNORMAL HIGH (ref 0.4–1.5)
Glucose, Bld: 214 mg/dL — ABNORMAL HIGH (ref 70–99)
Potassium: 3.2 mEq/L — ABNORMAL LOW (ref 3.5–5.1)

## 2010-05-27 LAB — LACTIC ACID, PLASMA: Lactic Acid, Venous: 2 mmol/L (ref 0.5–2.2)

## 2010-05-28 LAB — CBC
HCT: 34.9 % — ABNORMAL LOW (ref 39.0–52.0)
Hemoglobin: 11 g/dL — ABNORMAL LOW (ref 13.0–17.0)
Hemoglobin: 11.3 g/dL — ABNORMAL LOW (ref 13.0–17.0)
Hemoglobin: 11.4 g/dL — ABNORMAL LOW (ref 13.0–17.0)
MCHC: 32.2 g/dL (ref 30.0–36.0)
MCHC: 32.2 g/dL (ref 30.0–36.0)
MCHC: 32.5 g/dL (ref 30.0–36.0)
MCHC: 32.8 g/dL (ref 30.0–36.0)
MCV: 83.9 fL (ref 78.0–100.0)
MCV: 84 fL (ref 78.0–100.0)
Platelets: 331 10*3/uL (ref 150–400)
RBC: 4.05 MIL/uL — ABNORMAL LOW (ref 4.22–5.81)
RBC: 4.25 MIL/uL (ref 4.22–5.81)
RDW: 15 % (ref 11.5–15.5)
RDW: 15.2 % (ref 11.5–15.5)
WBC: 7.5 10*3/uL (ref 4.0–10.5)

## 2010-05-28 LAB — GLUCOSE, CAPILLARY
Glucose-Capillary: 135 mg/dL — ABNORMAL HIGH (ref 70–99)
Glucose-Capillary: 181 mg/dL — ABNORMAL HIGH (ref 70–99)
Glucose-Capillary: 183 mg/dL — ABNORMAL HIGH (ref 70–99)

## 2010-05-28 LAB — DIFFERENTIAL
Eosinophils Absolute: 0.1 10*3/uL (ref 0.0–0.7)
Eosinophils Relative: 1 % (ref 0–5)
Lymphocytes Relative: 21 % (ref 12–46)
Lymphs Abs: 1.6 10*3/uL (ref 0.7–4.0)
Monocytes Absolute: 1 10*3/uL (ref 0.1–1.0)
Monocytes Relative: 13 % — ABNORMAL HIGH (ref 3–12)

## 2010-05-28 LAB — GASTRIC OCCULT BLOOD (1-CARD TO LAB)

## 2010-05-28 LAB — URINALYSIS, ROUTINE W REFLEX MICROSCOPIC
Bilirubin Urine: NEGATIVE
Glucose, UA: NEGATIVE mg/dL
Hgb urine dipstick: NEGATIVE
Protein, ur: NEGATIVE mg/dL
Specific Gravity, Urine: 1.019 (ref 1.005–1.030)
Urobilinogen, UA: 1 mg/dL (ref 0.0–1.0)

## 2010-05-28 LAB — COMPREHENSIVE METABOLIC PANEL
ALT: 19 U/L (ref 0–53)
AST: 22 U/L (ref 0–37)
Albumin: 3.1 g/dL — ABNORMAL LOW (ref 3.5–5.2)
CO2: 26 mEq/L (ref 19–32)
Calcium: 9.2 mg/dL (ref 8.4–10.5)
Creatinine, Ser: 3.23 mg/dL — ABNORMAL HIGH (ref 0.4–1.5)
GFR calc Af Amer: 24 mL/min — ABNORMAL LOW (ref >60)
GFR calc non Af Amer: 20 mL/min — ABNORMAL LOW (ref >60)
Sodium: 137 mEq/L (ref 135–145)

## 2010-05-28 LAB — BASIC METABOLIC PANEL
BUN: 65 mg/dL — ABNORMAL HIGH (ref 6–23)
BUN: 66 mg/dL — ABNORMAL HIGH (ref 6–23)
CO2: 27 mEq/L (ref 19–32)
CO2: 29 mEq/L (ref 19–32)
Chloride: 100 mEq/L (ref 96–112)
Chloride: 101 mEq/L (ref 96–112)
Chloride: 104 mEq/L (ref 96–112)
Creatinine, Ser: 2.92 mg/dL — ABNORMAL HIGH (ref 0.4–1.5)
GFR calc Af Amer: 29 mL/min — ABNORMAL LOW (ref >60)
GFR calc non Af Amer: 24 mL/min — ABNORMAL LOW (ref >60)
GFR calc non Af Amer: 29 mL/min — ABNORMAL LOW (ref >60)
Glucose, Bld: 176 mg/dL — ABNORMAL HIGH (ref 70–99)
Glucose, Bld: 221 mg/dL — ABNORMAL HIGH (ref 70–99)
Potassium: 4.1 mEq/L (ref 3.5–5.1)
Potassium: 4.2 mEq/L (ref 3.5–5.1)
Potassium: 4.6 mEq/L (ref 3.5–5.1)
Sodium: 144 mEq/L (ref 135–145)

## 2010-05-28 LAB — URINE CULTURE: Colony Count: 5000

## 2010-05-28 LAB — LIPID PANEL
HDL: 28 mg/dL — ABNORMAL LOW (ref >39)
Triglycerides: 102 mg/dL (ref <150)
VLDL: 20 mg/dL (ref 0–40)

## 2010-05-28 LAB — SODIUM, URINE, RANDOM: Sodium, Ur: 47 mEq/L

## 2010-05-28 LAB — CREATININE, URINE, RANDOM: Creatinine, Urine: 122.1 mg/dL

## 2010-05-28 LAB — BRAIN NATRIURETIC PEPTIDE: Pro B Natriuretic peptide (BNP): 43 pg/mL (ref 0.0–100.0)

## 2010-05-30 LAB — BASIC METABOLIC PANEL
BUN: 15 mg/dL (ref 6–23)
BUN: 19 mg/dL (ref 6–23)
CO2: 29 mEq/L (ref 19–32)
Calcium: 9.5 mg/dL (ref 8.4–10.5)
Chloride: 101 mEq/L (ref 96–112)
Creatinine, Ser: 1.32 mg/dL (ref 0.4–1.5)
Creatinine, Ser: 1.32 mg/dL (ref 0.4–1.5)
Creatinine, Ser: 1.47 mg/dL (ref 0.4–1.5)
GFR calc Af Amer: >60 mL/min (ref >60)
GFR calc Af Amer: >60 mL/min (ref >60)
GFR calc non Af Amer: 57 mL/min — ABNORMAL LOW (ref >60)
Glucose, Bld: 134 mg/dL — ABNORMAL HIGH (ref 70–99)
Potassium: 4.2 mEq/L (ref 3.5–5.1)
Potassium: 4.3 mEq/L (ref 3.5–5.1)
Sodium: 134 mEq/L — ABNORMAL LOW (ref 135–145)

## 2010-05-30 LAB — URINALYSIS, ROUTINE W REFLEX MICROSCOPIC
Glucose, UA: NEGATIVE mg/dL
Hgb urine dipstick: NEGATIVE
Ketones, ur: NEGATIVE mg/dL
Protein, ur: NEGATIVE mg/dL
pH: 5.5 (ref 5.0–8.0)

## 2010-05-30 LAB — FOLATE: Folate: >20 ng/mL

## 2010-05-30 LAB — DIFFERENTIAL
Basophils Absolute: 0 10*3/uL (ref 0.0–0.1)
Basophils Relative: 1 % (ref 0–1)
Neutro Abs: 1.7 10*3/uL (ref 1.7–7.7)
Neutrophils Relative %: 47 % (ref 43–77)

## 2010-05-30 LAB — GLUCOSE, CAPILLARY
Glucose-Capillary: 158 mg/dL — ABNORMAL HIGH (ref 70–99)
Glucose-Capillary: 186 mg/dL — ABNORMAL HIGH (ref 70–99)
Glucose-Capillary: 190 mg/dL — ABNORMAL HIGH (ref 70–99)
Glucose-Capillary: 222 mg/dL — ABNORMAL HIGH (ref 70–99)
Glucose-Capillary: 243 mg/dL — ABNORMAL HIGH (ref 70–99)
Glucose-Capillary: 254 mg/dL — ABNORMAL HIGH (ref 70–99)
Glucose-Capillary: 267 mg/dL — ABNORMAL HIGH (ref 70–99)
Glucose-Capillary: 267 mg/dL — ABNORMAL HIGH (ref 70–99)
Glucose-Capillary: 273 mg/dL — ABNORMAL HIGH (ref 70–99)

## 2010-05-30 LAB — CK TOTAL AND CKMB (NOT AT ARMC)
CK, MB: 1.4 ng/mL (ref 0.3–4.0)
Total CK: 126 U/L (ref 7–232)

## 2010-05-30 LAB — CBC
MCHC: 33.1 g/dL (ref 30.0–36.0)
MCV: 81.3 fL (ref 78.0–100.0)
Platelets: 203 10*3/uL (ref 150–400)
RBC: 4.17 MIL/uL — ABNORMAL LOW (ref 4.22–5.81)
RDW: 16.7 % — ABNORMAL HIGH (ref 11.5–15.5)
WBC: 4.3 10*3/uL (ref 4.0–10.5)

## 2010-05-30 LAB — CARDIAC PANEL(CRET KIN+CKTOT+MB+TROPI)
CK, MB: 1.6 ng/mL (ref 0.3–4.0)
Relative Index: 1.2 (ref 0.0–2.5)
Total CK: 136 U/L (ref 7–232)
Troponin I: 0.02 ng/mL (ref 0.00–0.06)

## 2010-05-30 LAB — RETICULOCYTES
RBC.: 4.36 MIL/uL (ref 4.22–5.81)
Retic Ct Pct: 0.7 % (ref 0.4–3.1)

## 2010-05-30 LAB — HEMOGLOBIN A1C: Hgb A1c MFr Bld: 7.7 % — ABNORMAL HIGH (ref <5.7)

## 2010-05-31 LAB — BODY FLUID CELL COUNT WITH DIFFERENTIAL
Eos, Fluid: 23 %
Monocyte-Macrophage-Serous Fluid: 22 % — ABNORMAL LOW (ref 50–90)
Neutrophil Count, Fluid: 8 % (ref 0–25)

## 2010-05-31 LAB — CBC
HCT: 28.3 % — ABNORMAL LOW (ref 39.0–52.0)
HCT: 29.1 % — ABNORMAL LOW (ref 39.0–52.0)
HCT: 29.2 % — ABNORMAL LOW (ref 39.0–52.0)
HCT: 29.3 % — ABNORMAL LOW (ref 39.0–52.0)
HCT: 30.5 % — ABNORMAL LOW (ref 39.0–52.0)
HCT: 30.5 % — ABNORMAL LOW (ref 39.0–52.0)
HCT: 31.2 % — ABNORMAL LOW (ref 39.0–52.0)
HCT: 31.4 % — ABNORMAL LOW (ref 39.0–52.0)
HCT: 31.9 % — ABNORMAL LOW (ref 39.0–52.0)
HCT: 32.4 % — ABNORMAL LOW (ref 39.0–52.0)
HCT: 32.8 % — ABNORMAL LOW (ref 39.0–52.0)
HCT: 33.5 % — ABNORMAL LOW (ref 39.0–52.0)
Hemoglobin: 10.4 g/dL — ABNORMAL LOW (ref 13.0–17.0)
Hemoglobin: 11 g/dL — ABNORMAL LOW (ref 13.0–17.0)
Hemoglobin: 11 g/dL — ABNORMAL LOW (ref 13.0–17.0)
Hemoglobin: 11 g/dL — ABNORMAL LOW (ref 13.0–17.0)
Hemoglobin: 8.8 g/dL — ABNORMAL LOW (ref 13.0–17.0)
Hemoglobin: 9.4 g/dL — ABNORMAL LOW (ref 13.0–17.0)
Hemoglobin: 9.4 g/dL — ABNORMAL LOW (ref 13.0–17.0)
Hemoglobin: 9.6 g/dL — ABNORMAL LOW (ref 13.0–17.0)
MCHC: 31.9 g/dL (ref 30.0–36.0)
MCHC: 32.1 g/dL (ref 30.0–36.0)
MCHC: 32.3 g/dL (ref 30.0–36.0)
MCHC: 32.4 g/dL (ref 30.0–36.0)
MCHC: 32.5 g/dL (ref 30.0–36.0)
MCHC: 32.8 g/dL (ref 30.0–36.0)
MCV: 83.3 fL (ref 78.0–100.0)
MCV: 83.7 fL (ref 78.0–100.0)
MCV: 84.2 fL (ref 78.0–100.0)
MCV: 84.4 fL (ref 78.0–100.0)
MCV: 84.4 fL (ref 78.0–100.0)
MCV: 84.5 fL (ref 78.0–100.0)
MCV: 84.6 fL (ref 78.0–100.0)
MCV: 85.1 fL (ref 78.0–100.0)
MCV: 85.4 fL (ref 78.0–100.0)
MCV: 86 fL (ref 78.0–100.0)
Platelets: 216 10*3/uL (ref 150–400)
Platelets: 255 10*3/uL (ref 150–400)
Platelets: 257 10*3/uL (ref 150–400)
Platelets: 260 10*3/uL (ref 150–400)
Platelets: 286 10*3/uL (ref 150–400)
Platelets: 287 10*3/uL (ref 150–400)
RBC: 3.23 MIL/uL — ABNORMAL LOW (ref 4.22–5.81)
RBC: 3.36 MIL/uL — ABNORMAL LOW (ref 4.22–5.81)
RBC: 3.43 MIL/uL — ABNORMAL LOW (ref 4.22–5.81)
RBC: 3.46 MIL/uL — ABNORMAL LOW (ref 4.22–5.81)
RBC: 3.49 MIL/uL — ABNORMAL LOW (ref 4.22–5.81)
RBC: 3.72 MIL/uL — ABNORMAL LOW (ref 4.22–5.81)
RBC: 3.84 MIL/uL — ABNORMAL LOW (ref 4.22–5.81)
RBC: 3.88 MIL/uL — ABNORMAL LOW (ref 4.22–5.81)
RBC: 3.92 MIL/uL — ABNORMAL LOW (ref 4.22–5.81)
RBC: 3.95 MIL/uL — ABNORMAL LOW (ref 4.22–5.81)
RDW: 15.1 % (ref 11.5–15.5)
RDW: 15.1 % (ref 11.5–15.5)
RDW: 15.3 % (ref 11.5–15.5)
RDW: 15.3 % (ref 11.5–15.5)
RDW: 15.4 % (ref 11.5–15.5)
RDW: 15.4 % (ref 11.5–15.5)
RDW: 15.6 % — ABNORMAL HIGH (ref 11.5–15.5)
WBC: 4.6 10*3/uL (ref 4.0–10.5)
WBC: 4.6 10*3/uL (ref 4.0–10.5)
WBC: 4.8 10*3/uL (ref 4.0–10.5)
WBC: 5 10*3/uL (ref 4.0–10.5)
WBC: 5.2 10*3/uL (ref 4.0–10.5)
WBC: 5.4 10*3/uL (ref 4.0–10.5)
WBC: 5.6 10*3/uL (ref 4.0–10.5)
WBC: 5.8 10*3/uL (ref 4.0–10.5)
WBC: 6.6 10*3/uL (ref 4.0–10.5)
WBC: 7.5 10*3/uL (ref 4.0–10.5)
WBC: 7.7 10*3/uL (ref 4.0–10.5)

## 2010-05-31 LAB — BASIC METABOLIC PANEL
BUN: 10 mg/dL (ref 6–23)
BUN: 17 mg/dL (ref 6–23)
BUN: 4 mg/dL — ABNORMAL LOW (ref 6–23)
BUN: 7 mg/dL (ref 6–23)
CO2: 25 mEq/L (ref 19–32)
CO2: 26 mEq/L (ref 19–32)
CO2: 27 mEq/L (ref 19–32)
Chloride: 109 mEq/L (ref 96–112)
Chloride: 109 mEq/L (ref 96–112)
Chloride: 110 mEq/L (ref 96–112)
Chloride: 110 mEq/L (ref 96–112)
Creatinine, Ser: 1.62 mg/dL — ABNORMAL HIGH (ref 0.4–1.5)
GFR calc Af Amer: 50 mL/min — ABNORMAL LOW (ref >60)
GFR calc Af Amer: 54 mL/min — ABNORMAL LOW (ref >60)
GFR calc Af Amer: 54 mL/min — ABNORMAL LOW (ref >60)
GFR calc non Af Amer: 45 mL/min — ABNORMAL LOW (ref >60)
GFR calc non Af Amer: 45 mL/min — ABNORMAL LOW (ref >60)
GFR calc non Af Amer: 50 mL/min — ABNORMAL LOW (ref >60)
Glucose, Bld: 171 mg/dL — ABNORMAL HIGH (ref 70–99)
Glucose, Bld: 173 mg/dL — ABNORMAL HIGH (ref 70–99)
Glucose, Bld: 201 mg/dL — ABNORMAL HIGH (ref 70–99)
Potassium: 2.8 mEq/L — ABNORMAL LOW (ref 3.5–5.1)
Potassium: 3 mEq/L — ABNORMAL LOW (ref 3.5–5.1)
Potassium: 3.2 mEq/L — ABNORMAL LOW (ref 3.5–5.1)
Potassium: 3.4 mEq/L — ABNORMAL LOW (ref 3.5–5.1)
Potassium: 3.6 mEq/L (ref 3.5–5.1)
Potassium: 4.4 mEq/L (ref 3.5–5.1)
Sodium: 139 mEq/L (ref 135–145)
Sodium: 139 mEq/L (ref 135–145)
Sodium: 141 mEq/L (ref 135–145)

## 2010-05-31 LAB — URINALYSIS, ROUTINE W REFLEX MICROSCOPIC
Bilirubin Urine: NEGATIVE
Hgb urine dipstick: NEGATIVE
Ketones, ur: 15 mg/dL — AB
Nitrite: NEGATIVE
Protein, ur: 30 mg/dL — AB
Specific Gravity, Urine: 1.023 (ref 1.005–1.030)
Urobilinogen, UA: 1 mg/dL (ref 0.0–1.0)

## 2010-05-31 LAB — GLUCOSE, CAPILLARY
Glucose-Capillary: 100 mg/dL — ABNORMAL HIGH (ref 70–99)
Glucose-Capillary: 110 mg/dL — ABNORMAL HIGH (ref 70–99)
Glucose-Capillary: 110 mg/dL — ABNORMAL HIGH (ref 70–99)
Glucose-Capillary: 110 mg/dL — ABNORMAL HIGH (ref 70–99)
Glucose-Capillary: 130 mg/dL — ABNORMAL HIGH (ref 70–99)
Glucose-Capillary: 130 mg/dL — ABNORMAL HIGH (ref 70–99)
Glucose-Capillary: 134 mg/dL — ABNORMAL HIGH (ref 70–99)
Glucose-Capillary: 144 mg/dL — ABNORMAL HIGH (ref 70–99)
Glucose-Capillary: 149 mg/dL — ABNORMAL HIGH (ref 70–99)
Glucose-Capillary: 155 mg/dL — ABNORMAL HIGH (ref 70–99)
Glucose-Capillary: 172 mg/dL — ABNORMAL HIGH (ref 70–99)
Glucose-Capillary: 180 mg/dL — ABNORMAL HIGH (ref 70–99)
Glucose-Capillary: 193 mg/dL — ABNORMAL HIGH (ref 70–99)
Glucose-Capillary: 217 mg/dL — ABNORMAL HIGH (ref 70–99)
Glucose-Capillary: 222 mg/dL — ABNORMAL HIGH (ref 70–99)
Glucose-Capillary: 80 mg/dL (ref 70–99)

## 2010-05-31 LAB — URINE MICROSCOPIC-ADD ON

## 2010-05-31 LAB — FUNGUS CULTURE W SMEAR: Fungal Smear: NONE SEEN

## 2010-05-31 LAB — GLUCOSE, SEROUS FLUID

## 2010-05-31 LAB — HEMOCCULT GUIAC POC 1CARD (OFFICE): Fecal Occult Bld: POSITIVE

## 2010-05-31 LAB — PH, BODY FLUID

## 2010-05-31 LAB — TRIGLYCERIDES, BODY FLUIDS: Triglycerides, Fluid: 40 mg/dL

## 2010-05-31 LAB — LACTATE DEHYDROGENASE, PLEURAL OR PERITONEAL FLUID: LD, Fluid: 435 U/L — ABNORMAL HIGH (ref 3–23)

## 2010-05-31 LAB — BODY FLUID CULTURE: Gram Stain: NONE SEEN

## 2010-05-31 LAB — CHOLESTEROL, BODY FLUID: Cholesterol, Fluid: 112 mg/dL

## 2010-05-31 LAB — AFB CULTURE WITH SMEAR (NOT AT ARMC)

## 2010-05-31 LAB — HEMATOCRIT, BODY FLUID: Hematocrit, Fluid: 2 %

## 2010-06-12 LAB — URINALYSIS, ROUTINE W REFLEX MICROSCOPIC
Glucose, UA: NEGATIVE mg/dL
Ketones, ur: NEGATIVE mg/dL
pH: 6 (ref 5.0–8.0)

## 2010-06-26 LAB — RENAL FUNCTION PANEL
CO2: 25 mEq/L (ref 19–32)
Calcium: 9 mg/dL (ref 8.4–10.5)
Chloride: 113 mEq/L — ABNORMAL HIGH (ref 96–112)
Creatinine, Ser: 1.38 mg/dL (ref 0.4–1.5)
GFR calc Af Amer: >60 mL/min (ref >60)
GFR calc non Af Amer: 54 mL/min — ABNORMAL LOW (ref >60)
Glucose, Bld: 159 mg/dL — ABNORMAL HIGH (ref 70–99)
Sodium: 141 mEq/L (ref 135–145)

## 2010-06-26 LAB — POCT I-STAT, CHEM 8
BUN: 47 mg/dL — ABNORMAL HIGH (ref 6–23)
Calcium, Ion: 1.2 mmol/L (ref 1.12–1.32)
Creatinine, Ser: 2.7 mg/dL — ABNORMAL HIGH (ref 0.4–1.5)
Glucose, Bld: 84 mg/dL (ref 70–99)
TCO2: 25 mmol/L (ref 0–100)

## 2010-06-26 LAB — CBC
HCT: 37.9 % — ABNORMAL LOW (ref 39.0–52.0)
Hemoglobin: 12.3 g/dL — ABNORMAL LOW (ref 13.0–17.0)
RDW: 15.2 % (ref 11.5–15.5)
WBC: 6.5 10*3/uL (ref 4.0–10.5)

## 2010-06-26 LAB — GLUCOSE, CAPILLARY
Glucose-Capillary: 103 mg/dL — ABNORMAL HIGH (ref 70–99)
Glucose-Capillary: 135 mg/dL — ABNORMAL HIGH (ref 70–99)
Glucose-Capillary: 157 mg/dL — ABNORMAL HIGH (ref 70–99)
Glucose-Capillary: 182 mg/dL — ABNORMAL HIGH (ref 70–99)
Glucose-Capillary: 258 mg/dL — ABNORMAL HIGH (ref 70–99)
Glucose-Capillary: 33 mg/dL — CL (ref 70–99)

## 2010-06-26 LAB — DIFFERENTIAL
Eosinophils Relative: 1 % (ref 0–5)
Lymphocytes Relative: 11 % — ABNORMAL LOW (ref 12–46)
Lymphs Abs: 0.7 10*3/uL (ref 0.7–4.0)
Monocytes Absolute: 0.3 10*3/uL (ref 0.1–1.0)

## 2010-06-26 LAB — HEMOGLOBIN A1C: Mean Plasma Glucose: 174 mg/dL

## 2010-06-26 LAB — POTASSIUM
Potassium: 4.6 mEq/L (ref 3.5–5.1)
Potassium: 4.6 mEq/L (ref 3.5–5.1)

## 2010-06-26 LAB — BASIC METABOLIC PANEL
CO2: 23 mEq/L (ref 19–32)
Calcium: 8.8 mg/dL (ref 8.4–10.5)
Creatinine, Ser: 2.07 mg/dL — ABNORMAL HIGH (ref 0.4–1.5)
Glucose, Bld: 138 mg/dL — ABNORMAL HIGH (ref 70–99)

## 2010-06-27 LAB — RENAL FUNCTION PANEL
BUN: 10 mg/dL (ref 6–23)
CO2: 26 mEq/L (ref 19–32)
Calcium: 8.4 mg/dL (ref 8.4–10.5)
Creatinine, Ser: 1.24 mg/dL (ref 0.4–1.5)
Glucose, Bld: 94 mg/dL (ref 70–99)
Phosphorus: 2.7 mg/dL (ref 2.3–4.6)

## 2010-06-27 LAB — GLUCOSE, CAPILLARY
Glucose-Capillary: 110 mg/dL — ABNORMAL HIGH (ref 70–99)
Glucose-Capillary: 187 mg/dL — ABNORMAL HIGH (ref 70–99)
Glucose-Capillary: 190 mg/dL — ABNORMAL HIGH (ref 70–99)
Glucose-Capillary: 233 mg/dL — ABNORMAL HIGH (ref 70–99)

## 2010-06-27 LAB — CBC
Hemoglobin: 10.5 g/dL — ABNORMAL LOW (ref 13.0–17.0)
MCHC: 32.8 g/dL (ref 30.0–36.0)
MCV: 82.1 fL (ref 78.0–100.0)
RBC: 3.91 MIL/uL — ABNORMAL LOW (ref 4.22–5.81)
RDW: 14.6 % (ref 11.5–15.5)

## 2010-07-25 NOTE — Procedures (Signed)
BYPASS GRAFT EVALUATION   INDICATION:  Followup evaluation of lower extremity bypass graft.   HISTORY:  Diabetes:  Yes.  Cardiac:  No.  Hypertension:  Yes.  Smoking:  No.  Previous Surgery:  Right pop-to-anterior tibial artery bypass graft on  March 11, 2006, by Dr. Hart Rochester.   SINGLE LEVEL ARTERIAL EXAM                               RIGHT              LEFT  Brachial:                    164                170  Anterior tibial:             174                61  Posterior tibial:            110                108  Peroneal:  Ankle/brachial index:        >1.0               0.64   PREVIOUS ABI:  Date: 01/07/2007  RIGHT:  >1.0  LEFT:  0.69   LOWER EXTREMITY BYPASS GRAFT DUPLEX EXAM:   DUPLEX:  Doppler arterial waveforms are triphasic proximal to, within  and distal to the right pop-to-anterior tibial artery bypass graft.   IMPRESSION:  1. ABIs are stable from previous study bilaterally.  2. Patent right pop to anterior tibial artery bypass graft.      ___________________________________________  Quita Skye. Hart Rochester, M.D.   MC/MEDQ  D:  06/24/2007  T:  06/24/2007  Job:  [id number]

## 2010-07-25 NOTE — Procedures (Signed)
BYPASS GRAFT EVALUATION   INDICATION:  Followup evaluation of right leg bypass graft.  Right calf  claudication.   HISTORY:  Diabetes:  Yes  Cardiac:  No  Hypertension:  Yes  Smoking:  No  Previous Surgery:  Right pop to anterior tibial artery bypass graft and  right 1st and 2nd toe amputation on March 11, 2006, by Dr. Hart Rochester   SINGLE LEVEL ARTERIAL EXAM                               RIGHT              LEFT  Brachial:                    160                160  Anterior tibial:             166                110  Posterior tibial:            142                100  Peroneal:  Ankle/brachial index:        >1.0               0.69   PREVIOUS ABI:  Date: April 09, 2006  RIGHT:  >1.0  LEFT:  0.77   LOWER EXTREMITY BYPASS GRAFT DUPLEX EXAM:   DUPLEX:  Doppler arterial waveforms are triphasic proximal to and distal  to the right popliteal to anterior tibial artery bypass graft.   IMPRESSION:  1. ABIs are stable from previous study bilaterally.  2. Patent left popliteal to anterior tibial artery bypass graft.   ___________________________________________  Quita Skye. Hart Rochester, M.D.   MC/MEDQ  D:  01/07/2007  T:  01/08/2007  Job:  [id number]

## 2010-07-25 NOTE — Discharge Summary (Signed)
NAME:  Louis English, Louis English NO.:  [id number]   MEDICAL RECORD NO.:  [id number]          PATIENT TYPE:  INP   LOCATION:  5532                         FACILITY:  [facility]   PHYSICIAN:  Lonia Blood, M.D.DATE OF BIRTH:  1956/01/11   DATE OF ADMISSION:  04/09/2008  DATE OF DISCHARGE:  04/12/2008                               DISCHARGE SUMMARY   PRIMARY CARE PHYSICIAN:  Attending of record at [facility] assisted  living facility.   DISCHARGE DIAGNOSES:  1. Acute renal failure.      a.     Prerenal azotemia due to dehydration due to diuretic use       further complicated by ACE inhibitor use.      b.     Rapidly corrected with IV fluid resuscitation.      c.     Recommendation to avoid diuretics in the future.      d.     Extreme caution with ACE inhibitors in the future only with       close laboratory monitoring.  2. Severe hyperkalemia - secondary to above - resolved.  3. Uncontrolled diabetes mellitus.      a.     Due to prolonged elevated serum levels of Glucotrol in       setting of acute renal failure.      b.     Improving at discharge.  4. Hypertension - controlled.  5. Legal blindness secondary diabetic retinopathy.  6. Diabetic neuropathy.  7. 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  8. Mental retardation.   DISCHARGE MEDICATIONS:  1. Glipizide 10 mg p.o. daily.  2. Atenolol 1 mg p.o. daily.  3. Lexapro 30 mg p.o. daily.  4. Norvasc 10 mg p.o. daily.  5. Aspirin 81 mg p.o. daily.  6. Zantac 150 mg p.o. nightly.  7. Multivitamin one p.o. daily.   FOLLOW UP:  Ongoing care will be provided by the attending of record at  [facility].  This was previously Dr. Leslee Home.  It is  recommended that a BMET be obtained in 3 to 5 days to assure the  patient's potassium remains balanced.  It is 4.1 at the  time of the  patient's discharge.  CBG should also be followed very closely to assure  that further titration of diabetic medications is not required.  Close  attention will need to be paid to the patient's volume status and he  will need to be encouraged to increase his p.o. intake of liquids.   PROCEDURE:  Renal ultrasound revealing no evidence of hydronephrosis  dated April 09, 2008.   CONSULTATIONS:  None.   HOSPITAL COURSE:  [name] is a very pleasant 55 year old  gentleman with a complex medical history detailed above.  He has resided  in an assisted living facility for multiple years now.  He was sent to  the hospital on April 10, 2008 after labs revealed a markedly  elevated  potassium as well as severe acute renal failure.  In the emergency room,  the patient was found to have a potassium of 6.2 with a creatinine of  2.07.  Renal ultrasound was accomplished.  There is no evidence of acute  hydronephrosis.  Review of records and thorough history revealed that  the patient was on both ACE inhibitor as well as a diuretic.  The  patient was also found to be significantly clinically volume depleted.  The patient's diuretic and ACE inhibitor were discontinued.  The patient  was volume resuscitated using isotonic saline solution.  The patient  tolerated this well.  The patient's renal function rapidly improved.  High doses of Kayexalate were required to correct the patient's  hyperkalemia.  With normalization of the patient's renal function and  with completion of Kayexalate dosing, the patient's potassium  normalized.  No further difficulty with renal insufficiency was  encountered.  The patient's renal failure is felt to be due to  combination of prerenal azotemia due to diuretics and dehydration due to  decreased intake as well as concomitant use of an ACE inhibitor.  It is  recommended that the patient be followed very closely by the staff at  the assisted living  facility and they will be sure that his p.o. intake  of liquids is sufficient.  It is suggested that all diuretics be avoided  in this patient.  If ACE inhibitor is reinitiated, the patient's  creatinine should be followed very closely.   During the hospital stay the patient had difficulty with hypoglycemia.  This was secondary to the markedly prolonged serum half-life of  Glucotrol in the setting of decreased renal clearance to acute renal  failure.  This was able to be corrected easily with supplemental  nutritional intake.  The patient's CBG was stabilizing at the time of  his discharge.   By April 12, 2008 the patient was found to be medically stable.  He  was tolerating a regular diet and labs had normalized.  Creatinine was  1.24, potassium of 4.1.  The patient was felt to be stable for  discharge.  Arrangements were being made and the plan is to return the  patient to his [facility] facility in the morning.      Lonia Blood, M.D.  Electronically Signed     JTM/MEDQ  D:  04/12/2008  T:  04/12/2008  Job:  [id number]

## 2010-07-25 NOTE — Procedures (Signed)
BYPASS GRAFT EVALUATION   INDICATION:  Follow-up evaluation of lower extremity bypass graft.   HISTORY:  Diabetes:  Yes.  Cardiac:  No.  Hypertension:  Yes.  Smoking:  No.  Previous Surgery:  Right pop to anterior tibial artery bypass graft on  03/11/2006 by Dr. Hart Rochester.   SINGLE LEVEL ARTERIAL EXAM                               RIGHT              LEFT  Brachial:                    140                146  Anterior tibial:             148                108  Posterior tibial:            120                92  Peroneal:  Ankle/brachial index:        >1.0               0.73   PREVIOUS ABI:  Date: 06/24/07  RIGHT:  >1.0  LEFT:  0.64   LOWER EXTREMITY BYPASS GRAFT DUPLEX EXAM:   DUPLEX:  Doppler arterial waveforms are biphasic proximal to, within,  and distal to the right pop to anterior tibial artery bypass graft.   IMPRESSION:  1. Ankle brachial indices are stable from previous study bilaterally.  2. Patent right popliteal to anterior tibial artery bypass graft.   ___________________________________________  Quita Skye. Hart Rochester, M.D.   MC/MEDQ  D:  12/23/2007  T:  12/23/2007  Job:  [id number]

## 2010-07-25 NOTE — Procedures (Signed)
BYPASS GRAFT EVALUATION   INDICATION:  Followup right lower extremity bypass graft.   HISTORY:  Diabetes:  Yes.  Cardiac:  No.  Hypertension:  Yes.  Smoking:  No.  Previous Surgery:  Right popliteal to anterior tibial artery bypass  graft on 03/11/2006.   SINGLE LEVEL ARTERIAL EXAM                               RIGHT              LEFT  Brachial:                    92                 101  Anterior tibial:             110  Posterior tibial:            101  Peroneal:  Ankle/brachial index:        1.09.              BKA   PREVIOUS ABI:  Date: 02/09/2009  RIGHT:  0.99  LEFT:  0.91   LOWER EXTREMITY BYPASS GRAFT DUPLEX EXAM:   DUPLEX:  Triphasic Doppler waveforms noted throughout the right lower  extremity bypass graft with no increase in velocities.   IMPRESSION:  1. Patent right popliteal to anterior tibial artery bypass graft and      no evidence of stenosis.  2. Triphasic Doppler waveforms and right ankle brachial index suggest      normal perfusion of the right lower extremity.  The right ABI      appears stable when compared to the previous exam.         ___________________________________________  Quita Skye. Hart Rochester, M.D.   CH/MEDQ  D:  03/22/2010  T:  03/22/2010  Job:  [id number]

## 2010-07-25 NOTE — Consult Note (Signed)
NEW PATIENT CONSULTATION   Louis English, Louis English  DOB:  12-04-1955                                       03/21/2010  CHART#:[id number]   Patient is a 55 year old male patient resident of [facility].  He previously underwent a right popliteal to anterior tibial  bypass grafting by me in December 2007 with a right first toe  amputation.  His foot eventually healed, and his bypasses remained  patent.  We have followed this in the lab.  He also later developed  ischemia of his left leg and underwent a left below-knee amputation by  Dr. Magnus Ivan.  He has had no subsequent ischemic events in his right  lower extremity.   CHRONIC MEDICAL PROBLEMS:  1. Degeneration.  2. Hypertension.  3. Diabetes mellitus.  4. Blindness secondary to diabetes mellitus.  5. Severe peripheral vascular disease.  6. Gastroparesis.  7. Malnutrition.  8. Depression.   FAMILY HISTORY:  Unknown due to patient being a poor historian.   SOCIAL HISTORY:  The patient is a former smoker.  Currently lives in a  skilled nursing facility.   The review of systems was done.  The patient denied any symptomatology  in a complete review of systems.   PHYSICAL EXAMINATION:  Blood pressure is 140/70, heart rate 70,  respirations 14.  General:  He is a chronically ill-appearing male who  is in no apparent distress.  He is alert and oriented x3.  He is a poor  historian.  HEENT:  Normal for age.  EOMs intact.  Lungs:  Clear to  auscultation.  No rhonchi or wheezing.  Cardiovascular:  Regular rhythm.  No murmurs.  Carotid pulses are 3+.  No bruits audible.  Abdomen:  Soft,  nontender with no masses.  Musculoskeletal:  No major deformities.  Neurologic:  Normal.  Skin:  Free of rashes.  Left leg has a well-healed  below-knee amputation stump.  The right leg has a palpable graft pulse  in the popliteal to anterior tibial artery at the ankle level with a  well-healed right first toe amputation  site.  No other ischemia.   Today I ordered a scan of his right lower extremity graft, which I have  reviewed and interpreted.  The graft is widely patent.  There is  triphasic flow distally with an ABI of 1.0.  No evidence of stenosis in  the bypass.   We will follow this patient annually in the vascular lab for patency of  the right leg graft or sooner on a p.r.n. basis.     Quita Skye Hart Rochester, M.D.  Electronically Signed   JDL/MEDQ  D:  03/21/2010  T:  03/21/2010  Job:  [id number]   cc:   [facility]

## 2010-07-25 NOTE — Procedures (Signed)
BYPASS GRAFT EVALUATION   INDICATION:  Right lower extremity bypass graft.   HISTORY:  Diabetes:  Yes  Cardiac:  No  Hypertension:  Yes  Smoking:  No  Previous Surgery:  Right popliteal to anterior tibial artery bypass  graft on 03/11/2006.    SINGLE LEVEL ARTERIAL EXAM                               RIGHT              LEFT  Brachial:                    134                130  Anterior tibial:             130                100  Posterior tibial:  Peroneal:                    120                96  Ankle/brachial index:        0.97               0.74   PREVIOUS ABI:  Date: 12/23/2007  RIGHT:  Greater than 1.0  LEFT:  Greater than 0.73    LOWER EXTREMITY BYPASS GRAFT DUPLEX EXAM:   DUPLEX:  Triphasic Doppler wave forms noted throughout the right lower  extremity bypass graft and its native vessels with no increased  velocities noted.   IMPRESSION:  Patent right popliteal to anterior tibial artery bypass  graft with no evidence of stenosis.  Stable ankle brachial indices noted.       ___________________________________________  Louis English, M.D.   CH/MEDQ  D:  06/22/2008  T:  06/22/2008  Job:  [id number]

## 2010-07-25 NOTE — H&P (Signed)
Louis English, Louis English NO.:  [id number]   MEDICAL RECORD NO.:  [id number]          PATIENT TYPE:  INP   LOCATION:  5532                         FACILITY:  [facility]   PHYSICIAN:  Della Goo, M.D. DATE OF BIRTH:  1955/12/16   DATE OF ADMISSION:  04/10/2008  DATE OF DISCHARGE:                              HISTORY & PHYSICAL   DATE OF ADMISSION:  April 10, 2008.   PRIMARY CARE PHYSICIAN:  Unassigned,  Dr. Gifford Shave.   CHIEF COMPLAINT:  Abnormal laboratory studies.   HISTORY OF PRESENT ILLNESS:  This is a 54 year old male who resides at  the [facility], who was sent to the emergency  department at [facility] for evaluation secondary to abnormal  laboratory studies.  The patient is mildly mentally impaired and is  unable to give a history, and does not know why he was sent to the  emergency department.  He denies having any discomfort.  Denies having  any nausea, vomiting, diarrhea, decreased intake of foods and liquids.  Per the medical reports, the patient has not had any fevers, chills,  congestion, nausea, vomiting, diarrhea as well.   LABORATORY DATA:  In the emergency department, the patient had  laboratory studies performed which did reveal an elevated potassium  level at 5.8 initially and a BUN and creatinine which were also elevated  compared to previous.  The current level was found to be a BUN of 47 and  a creatinine of 2.7.  The patient was started on therapy to treat the  hyperkalemia and the acute renal failure with IV fluids, and also  administered 45 gm of Kayexalate therapy orally x1.  A repeat 6 hours  later revealed an increase in his potassium level to 6.2.  However, his  BUN and creatinine did mildly improve to 41 and 2.07.   PAST MEDICAL HISTORY:  1. Significant for type 2 diabetes mellitus.  2. Diabetic retinopathy.  3. Diabetic neuropathy.  4. Hypertension.  5. History of osteomyelitis.  6. Right  great toe status post amputation.  7. 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  8. Chronic renal insufficiency.  9. Mental retardation.  10.Microcytic anemia.   MEDICATIONS:  1. Amlodipine.  2. Aspirin.  3. Atenolol.  4. Furosemide.  5. Glipizide.  6. Lexapro.  7. Lisinopril.  8. Multivitamin.  9. Zantac.   ALLERGIES:  Per the medical records, NO KNOWN DRUG ALLERGIES.   SOCIAL HISTORY:  The patient resides at the [facility].  He is a nonsmoker, nondrinker and no history of illicit drug  usage.   FAMILY HISTORY:  Unable to obtain from the patient.  Per the medical  records, the patient was adopted and does not know his family history.   REVIEW OF SYSTEMS:  Pertinents are mentioned above.   PHYSICAL EXAMINATION:  GENERAL:  This is a confused 55 year old well-  nourished, well-developed male in no discomfort or acute distress.  VITAL SIGNS:  Temperature 97.0, blood pressure 126/77, heart rate 72,  respirations  20, O2 saturation is 98%.  HEENT:  Normocephalic, atraumatic.  Pupils are reactive to light  bilaterally.  Extraocular movements are intact.  Unable to visualize  funduscopically.  Nares are patent bilaterally.  Oropharynx:  Poor  sparse dentition.  Multiple caries are seen, otherwise there is no  tongue exudate, no tonsillar hypertrophy or exudates.  NECK:  Supple, full range of motion.  No thyromegaly, adenopathy,  jugular venous distention.  CARDIOVASCULAR:  Regular rate and rhythm.  No murmurs, gallops or rubs.  LUNGS:  Clear to auscultation bilaterally.  ABDOMEN:  Positive bowel sounds, soft, nontender, nondistended.  There  is no hepatosplenomegaly.  EXTREMITIES:  Without cyanosis, clubbing or edema.  The right great toe  is absent secondary to surgical amputation.  NEUROLOGIC:  The patient is mildly confused and has a mild cognitive  deficit.  His speech is clear.  He is able to answer  simple questions.  He is able to move all 4 of his extremities.  He does have changes  consistent with diabetic neuropathy and diabetic retinopathy, and is  visually impaired.   LABORATORY STUDIES:  White blood cell count 6.5, hemoglobin 12.3,  hematocrit 37.9, MCV 83.2, platelets 199, neutrophils 83%, lymphocytes  11%, sodium 139, potassium 5.8, chloride 111, CO2 25, BUN 47, creatinine  2.7 and glucose 84.   DIAGNOSTICS:  1. A renal ultrasound was performed results of which were negative for      findings of nephrolithiasis, obstruction or hydronephrosis,      however, interval mild diffuse bilateral renal atrophy is present.  2. An EKG was performed as well which revealed a normal sinus rhythm      without acute ST-segment changes.   ASSESSMENT:  A 55 year old male being admitted with:  1. Hyperkalemia.  2. Acute renal failure with chronic renal insufficiency.  3. Type 2 diabetes mellitus.  4. Mild anemia which appears to the normocytic.  5. Type 2 diabetes mellitus.  6. Hypertension.  7. Peripheral vascular disease.   PLAN:  The patient will be admitted to a telemetry area for cardiac  monitoring.  He will be placed on IV fluids to help improve his acute  renal failure.  The patient will also be administered Kayexalate therapy  rectally along with short-term therapy for the hyperkalemia as well.  His potassium levels will be checked q.6 h. and further treatment for  the hyperkalemia  will ensue when his potassium levels return if they  are greater than 5.3.  At this time, the culprit/etiology of his  hyperkalemia is felt to be possibly secondary to ACE inhibitor therapy  of lisinopril which has been held.  His BUN and creatinine may also be  elevated secondary to his continued therapy with the furosemide.  This  is also being held for now and the patient is being hydrated.  The  patient will be placed on DVT and GI prophylaxis and sliding-scale  insulin coverage has also been  ordered.  Further workup will ensue  pending results of the patient's clinical course and his studies.      Della Goo, M.D.  Electronically Signed     HJ/MEDQ  D:  04/10/2008  T:  04/10/2008  Job:  [id number]

## 2010-07-25 NOTE — Procedures (Signed)
BYPASS GRAFT EVALUATION   INDICATION:  Right lower extremity bypass graft.   HISTORY:  Diabetes:  Yes.  Cardiac:  No.  Hypertension:  Yes.  Smoking:  No.  Previous Surgery:  Right popliteal to anterior tibial artery bypass  graft on 03/11/2006.   SINGLE LEVEL ARTERIAL EXAM                               RIGHT              LEFT  Brachial:                    134                144  Anterior tibial:             142                110  Posterior tibial:            134                131  Peroneal:  Ankle/brachial index:        0.99               0.91   PREVIOUS ABI:  Date:  06/22/2008  RIGHT:  0.97  LEFT:  0.74   LOWER EXTREMITY BYPASS GRAFT DUPLEX EXAM:   DUPLEX:  Triphasic Doppler waveforms noted throughout the right lower  extremity bypass graft and its native vessels with no evidence of  stenosis.   IMPRESSION:  1. Patent right popliteal to anterior tibial artery bypass graft with      no evidence of stenosis.  2. Stable bilateral ankle brachial indices.   ___________________________________________  Quita Skye Hart [place], M.D.   CH/MEDQ  D:  02/09/2009  T:  02/10/2009  Job:  [id number]

## 2010-07-28 NOTE — H&P (Signed)
NAME:  Louis English, Louis English NO.:  [id number]   MEDICAL RECORD NO.:  [id number]          PATIENT TYPE:  INP   LOCATION:  1823                         FACILITY:  [facility]   PHYSICIAN:  Michaelyn Barter, M.D. DATE OF BIRTH:  1955/07/07   DATE OF ADMISSION:  01/25/2004  DATE OF DISCHARGE:                                HISTORY & PHYSICAL   PRIMARY CARE PHYSICIAN:  Unassigned.   CHIEF COMPLAINT:  Right big toe bleeding and swelling.   HISTORY OF PRESENT ILLNESS:  The patient is a 55 year old gentleman who  states that he has never been diagnosed with diabetes mellitus and then goes  on to say that his right big toenail came off earlier today as he attempted  to get dressed.  He stated that it started to bleed shortly afterwards and  it emitted a foul smell.  There was no pain at that time.  He went on to  state that prior to today he had not noticed any changes in his right big  toe.  He also complained of some pin and needle sensation in his right lower  leg and occasionally in his left leg.  However, the right leg pin and needle  sensation is greater than the left.  He denies nausea, emesis, fevers, or  chills.   PAST MEDICAL HISTORY:  The patient denies ever being told that he is  diabetic.  He also states that it has been many years since he has seen a  physician.   PAST SURGICAL HISTORY:  Never.   ALLERGIES:  None.   HOME MEDICATIONS:  None.   SOCIAL HISTORY:  The patient works at [employer].  Cigarettes:  The patient stopped seven years ago.  He smoked for 26 years  prior to that.  Alcohol:  The patient denies.  He stopped in 1979.  Street  drugs:  The patient has tried marijuana and cocaine.  The last time was in  the 1970s.   FAMILY HISTORY:  Father and mother unknown to the patient.  He states that  he was adopted as a child.   REVIEW OF SYSTEMS:  As per HPI, otherwise all other systems are negative.   PHYSICAL EXAMINATION:  GENERAL:   No obvious distress.  VITAL SIGNS:  Temperature 97.9, blood pressure 124/68, heart rate 76,  respirations 18.  HEENT:  Anicteric.  Extraocular movements are intact.  NECK:  Supple.  No lymphadenopathy.  Thyroid not palpable.  CARDIAC: S1 S2 present.  Regular rate and rhythm.  No S3, no S4.  LUNGS:  Clear bilaterally.  No crackles.  No wheezes.  CHEST:  There is a hyperpigmented rash over most of the patient's back and  anterior torso.  He states that it has been there for many years and that it  does not bother him.  ABDOMEN:  Flat, soft, nontender, nondistended.  Positive bowel sounds.  The  patient has some striate in the lower region of his abdomen most likely  secondary to weight loss.  EXTREMITIES:  The right big toe is swollen.  There is a foul smell emitted  from the dorsal surface of the right toe.  The toenail has completely been  removed.  There is an area of questionable necrosis present on the dorsal  surface of the toe.  The toe looks infected over the entire dorsal surface  where the toenail was previously.  There is no active bleeding seen at this  particular time.  The patient half covered the foot with a sock.  With  regards to the remaining regions of both of the legs, there is no obvious  swelling of either calf.   LABS:  The patient's sodium is 126.  His glucose is 420.  His albumin is  2.7.   X-ray was completed in the ER of the right foot.  It was interpreted by the  radiologist as destructive changes in the distal phalanx/great toe which are  consistent with osteomyelitis.   ASSESSMENT/PLAN:  1.  Osteomyelitis of the right large toe.  Intravenous antibiotics, Zosyn,      had been started in the emergency room.  I will continue Zosyn for now.      Will also order blood cultures times two sets and order cultures of the      dorsal surface of the right big toe.  We will provide pain medications      as needed.  We will consult infectious disease for further  antibiotic      recommendations, and we will also consult orthopedic surgery for      evaluation.  By the looks of the toe, there is a chance that the patient      may have to undergo an amputation and again we will consult orthopedic      surgery for their opinion.  2.  Hyperglycemia.  Although the patient states he has never been diagnosed      with diabetes mellitus, in light of his hyperglycemia, and also      osteomyelitis appearing big toe, and the sensation of pins and needles      in both of his lower extremities, it appears that there is a very high      likelihood that the patient does have diabetes mellitus.  We will check      a hemoglobin A1c, a fasting lipid profile, and start aspirin therapy.  3.  Hyponatremia.  Etiology is questionable.  We will investigate the      patient's volume status further and treat accordingly.  4.  Hypoalbuminemia.  This is most likely secondary to malnutrition.  We      will check a prealbumin level, may consider nutrition consult especially      in light of the patient's high probability of having diabetes mellitus.  5.  Bilateral leg pain.  Although this bilateral leg pain is most likely      secondary to peripheral neuropathy stemming from a condition of      undiagnosed diabetes mellitus, I will still order venous duplexes of      both lower extremities to rule out the possibility of deep vein      thrombosis.      Orla   OR/MEDQ  D:  01/25/2004  T:  01/26/2004  Job:  [id number]

## 2010-07-28 NOTE — Discharge Summary (Signed)
NAMECAN, LUCCI NO.:  [id number]   MEDICAL RECORD NO.:  [id number]          PATIENT TYPE:  INP   LOCATION:  5033                         FACILITY:  [facility]   PHYSICIAN:  Isidor Holts, M.D.  DATE OF BIRTH:  November 18, 1955   DATE OF ADMISSION:  01/25/2004  DATE OF DISCHARGE:  02/01/2004                                 DISCHARGE SUMMARY   PRIMARY MEDICAL DOCTOR:  Gentry Fitz.   PRIMARY DIAGNOSES:  1.  Osteomyelitis, right big toe.  2.  Amputation of right big toe to midportion of proximal phalanx, January 28, 2004.   SECONDARY DIAGNOSES:  1.  Newly diagnosed diabetes mellitus, type 2.  2.  Hypertension.  3.  Iron deficiency anemia.   DISCHARGE MEDICATIONS:  1.  Hydrochlorothiazide 25 mg p.o. daily.  2.  Lisinopril 40 mg p.o. daily.  3.  Norvasc 5 mg p.o. daily.  4.  Glucotrol XL 5 mg p.o. daily.  5.  Augmentin 875 mg p.o. b.i.d. for 1 week.  6.  Iron tablets OTC.   PROCEDURES:  1.  X-ray, right foot, dated January 25, 2004, findings suggestive of      osteomyelitis involving the distal phalanx of the great toe.  2.  Chest x-ray dated January 29, 2004.  This showed mild peribronchial      thickening without focal airspace disease.  3.  Arterial Doppler, lower extremities.  This showed right ankle brachial      index indicating mild reduction in arterial flow, left ankle brachial      index was within normal limits.  4.  Amputation of right great toe to midportion of proximal phalanx on      January 28, 2004 by Dr. Burnard Bunting, orthopedic surgeon.   CONSULTATIONS:  1.  Dr. Rockey Situ. Roxan Hockey, infectious diseases.  2.  Dr. Burnard Bunting, orthopedic surgeon.   ADMISSION HISTORY:  Admission history as in H&P, January 25, 2004, however,  in brief, this is a 55 year old male who has no known significant past  medical history, presenting because his right big toenail came off earlier  in the day while attempting to get dressed.  The patient  stated that it  started bleeding shortly afterwards and also emitted a foul smell.  He  denies pain at the time, however, has been having some paresthesia in the  right lower leg and occasionally in the left leg.  He is an ex-smoker, quit  approximately 7 years ago.  He was admitted for further evaluation,  investigation and treatment.   HOSPITAL COURSE:  PROBLEM #1 - OSTEOMYELITIS/INFECTION OF RIGHT BIG TOE:  Initial x-rays, i.e., on January 25, 2004 demonstrated osteomyelitis  involving the distal phalanx of the right big toe.  The patient had septic  workup done which proved negative.  He was, however, empirically commenced  on Zosyn.  Infectious disease consultation was called, which was kindly  provided by Dr. Roxan Hockey, who concurred with choice of antibiotic.  Dr.  Dorene Grebe was also invited on consult for orthopedics.  He evaluated the  patient and determined that the  situation called for amputation of the  affected digit. This was carried out on January 28, 2004.  The patient  tolerated the procedure well.  There were no complications.   PROBLEM #2 - DIABETES MELLITUS -- NEW DIAGNOSIS:  On admission, the patient  was found to have a blood glucose of about 420mg /dl.  He was managed with a  sliding-scale insulin and oral hypoglycemic medications as well as diabetic  diet, with improvement in his glycemia.   PROBLEM #3 - HYPERTENSION:  The patient showed labile blood pressure during  the course of his hospital stay with levels rising sometimes as high as 160  systolic and occasionally 200 mmHg systolic.  It required a combination of  antihypertensives including ACE inhibitor, calcium channel blockers and  hydrochlorothiazide to achieve normotension.   PROBLEM #4 - MICROCYTIC ANEMIA:  The patient had a mild microcytic anemia.  Hematologic workup revealed iron deficiency; he was therefore commenced on  iron supplements.  In addition, because of poor nutritional status, he was   given nutritional advise/recommendations during the course of his hospital  stay, after evaluation by a nutritionist.  He was subsequently discharged in  satisfactory condition on February 01, 2004.   DISPOSITION:   ACTIVITY:  Activities as tolerated.   DIET:  He was recommended a diabetic diet and low-salt diet.   WOUND CARE:  Wound care includes regular dressings.  He has also had  arrangements put in place for home health aide for wound dressings and  diabetic teaching/supervision of his medications.   FOLLOWUP:  He has a followup appointment with Dr. Dorene Grebe, orthopedics,  in 1 week and has been supplied the phone number.  He also has been given a  list of 5 M.D.s who he can call to establish a primary care M.D.  He  apparently does have insurance.  He has verbalized understanding and agreed  to abide by these recommendations.      Chri   CO/MEDQ  D:  02/02/2004  T:  02/03/2004  Job:  [id number]   cc:   G. Dorene Grebe, M.D.  Fax: [phone]

## 2010-07-28 NOTE — Discharge Summary (Signed)
NAME:  Louis English, Louis English NO.:  [id number]   MEDICAL RECORD NO.:  [id number]          PATIENT TYPE:  OIB   LOCATION:  5028                         FACILITY:  [facility]   PHYSICIAN:  Gertha Calkin, M.D.DATE OF BIRTH:  1955/07/26   DATE OF ADMISSION:  04/03/2004  DATE OF DISCHARGE:  04/05/2004                                 DISCHARGE SUMMARY   PRIMARY CARE PHYSICIAN:  To be assigned to [facility].   DISCHARGE DIAGNOSES:  1.  Uncontrolled diabetes.  2.  Recent status post osteomyelitis, right big toe and amputation.  3.  Hypertension.  4.  Diabetes, type 2.  5.  Diabetic neuropathy.  6.  Microcytic anemia.   DISCHARGE MEDICATIONS:  1.  HCTZ 25 mg p.o. every day.  2.  Lotensin 40 mg p.o. every day.  3.  Glucotrol XL 10 mg p.o. every day.  4.  Iron tablet over the counter.  5.  Darvocet-N 100 p.r.n.  6.  Augmentin 875 p.o. b.i.d. to finish a ten day course.   HOSPITAL COURSE:  1.  The patient was admitted to our service for hyperglycemia, poorly      controlled diabetes, status post recent amputation of right toe.  Please      see H&P for full details of admission.  During this hospitalization,      blood cultures were drawn first day but at this point the patient      remained afebrile without leukocytosis and was tolerating being switched      from IV antibiotics Zosyn to Augmentin.  At the time of discharge, the      patient remains afebrile without leukocytosis.  The patient will go home      to finish a ten day course of antibiotic treatment.  2.  Uncontrolled diabetes.  The patient is being sent home on an increased      dose of Glucotrol as this is a recent infection +/- realistic diet would      likely demonstrate that in the outpatient setting his glucose sugars are      running above the 200 range.  During this hospitalization, we had added      Lantus to his regimen while we were treated his sugars.  On the day of      discharge his sugars had only  dropped to the 100-200 range but this was      significantly improved from his admission sugars which was well above      300-400.  I have spoken with him and I feel safe to increase his      Glucotrol dose, and he will be monitoring his sugars during the      outpatient setting.  He is to follow up with [facility] regarding his      chronic medical illnesses as well as titration of these medications.  3.  Hypertension, controlled.  Have switched him from Lisinopril to Lotensin      due to budgetary constraints.  Continue his hydrochlorothiazide.  DC his      Norvasc.  These need to be added in a stepwise  fashion in the outpatient      setting when he is able to afford medications due to [facility] system.  4.  Diabetic neuropathy.  He did not complain much of that during this      hospitalization.  Again due to budgetary constraints, I will not DC him      with his Neurontin, however, this may be added back to his regimen once      the means are there.  He is being discharged home with Darvocet-N 100 to      treat his right toe pain which he did not complication much of during      this hospitalization.  5.  Dyslipidemia.  The patient's cholesterol profile returned with a total      cholesterol level of 155, triglycerides of 49, HDL of 54, and LDL of 91.      Again, secondary to medical financial constraints and the fact that his      lipids are not terribly out of control at this time, would push for diet      and exercise and recheck in approximately three months' time to      determine whether or not a Statin would benefit him.  Obviously in him      the goal for an LDL would be close to 70.  He does have an elevated HDL      which gives him some good cardiac protection.   DISPOSITION:  The patient is stable at discharge.  Afebrile.  He did spike  one temperature of 101.6 but currently he is on antibiotics and will treat  that with Tylenol.  He will finish his antibiotic course.   Follow up in the  [facility] system regarding his diabetes, hypertension, dyslipidemia.  This  also will be followed up.  I have given him numbers to call __________  Orthopedics who performed his amputation in November.      JD/MEDQ  D:  04/05/2004  T:  04/05/2004  Job:  [id number]   cc:   August Saucer, Dr.   [facility]

## 2010-07-28 NOTE — Op Note (Signed)
NAME:  Louis English, INTRIAGO NO.:  [id number]   MEDICAL RECORD NO.:  [id number]          PATIENT TYPE:  INP   LOCATION:  5703                         FACILITY:  [facility]   PHYSICIAN:  Kerrin Champagne, M.D.   DATE OF BIRTH:  1955-06-27   DATE OF PROCEDURE:  03/07/2006  DATE OF DISCHARGE:                               OPERATIVE REPORT   PREOPERATIVE DIAGNOSIS:  Right open great toe metatarsal phalangeal  joint amputation and debrided open second metatarsal head abscess with  osteomyelitis.   POSTOPERATIVE DIAGNOSIS:  Right open great toe metatarsal phalangeal  joint amputation and debrided open second metatarsal head abscess with  osteomyelitis.   PROCEDURE:  Revision of right great toe metatarsal phalangeal joint  amputation to a first ray deletion at the first metatarsal base.  Repeat  irrigation and debridement of second metatarsal head abscess.   SURGEON:  Kerrin Champagne, M.D.   ASSISTANT:  None.   ANESTHESIA:  General via oral tracheal intubation, Dr. Michelle Piper   SPECIMENS:  Great toe metatarsal distal 2/3.   DISPOSITION OF PATHOLOGY SPECIMENS:  The specimen was sent to pathology  for evaluation.   ESTIMATED BLOOD LOSS:  15 cc.   COMPLICATIONS:  None.   TOURNIQUET TIME:  Total tourniquet time at 300 mmHg, right leg 15  minutes. The incision was packed open, normal saline wet-to-dry.   DISPOSITION:  The patient returned to the PACU in good condition.   HISTORY OF PRESENT ILLNESS:  The patient is a 55 year old male who has a  right foot open amputation site from surgery performed on March 02, 2006 for an abscess over the right second toe metatarsal head, as well  as osteomyelitis changes involving the great toe proximal phalanx  remnants. He is status post previous great toe amputation at the  midportion of the proximal phalanx and second toe metatarsal phalangeal  joint amputation.  He developed fever and chills. Presented to the  emergency room this past  week on December 19. Evaluation was obtained.  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  He has diabetes.  Diabetic retinopathy.  He underwent the initial  debridement and has undergone dressing changes on the ward.  He has  returned back to the operating room to undergo revision of the  amputation site to a more proximal level.  During the interval, his  blood sugars appeared to be normalizing. His temperature has diminished  to normal.  He has undergone vascular testing which suggests that he may  have vascular occlusive disease.  An arteriogram is to be done later  this week.   INTRAOPERATIVE FINDINGS:  The patient was found to have some very small  areas of purulence in the soft tissue areas over the plantar flap of the  open amputation site.  This is primarily over the second metatarsal head  region.  These areas were lightly debrided as they represented both  cellulitis and small areas of retained purulence.  The great toe itself  was resected back  proximally to the flare at the metatarsal epiphyseal  region of the base of the great toe.   DESCRIPTION OF PROCEDURE:  After adequate general anesthesia, the right  lower extremity was prepped with Betadine scrub and prep solution.  Draped in the usual manner.  A tourniquet about the right upper thigh.  Initial incision was made along the medial plantar aspect of the great  toe in order to allow for flaps with adequate viability.  The incision  was carried directly down to the bone periosteum over the plantar medial  aspect of the great toe and continued proximally to the proximal  metaphyseal portion of the great toe metatarsal.  The incision was then  carried around the great toe excising the flexor tendons and the  sesamoid bones, then continued proximally where exposure was obtained of  the dorsal aspect of the proximal metatarsal  plantar aspect.  Metatarsal  Holman retractors were then inserted and the small oscillating saw was  then used to cut the metatarsal at its metaphysis using a bevel over the  plantar surface of the proximal portion of the great toe.  Then with the  toe under traction, the soft tissue was carefully debrided off the  lateral aspect of the metatarsal out to the tip of the metatarsal head  where this was then resected and removed.  Small areas of residual  purulence between the great toe and second toe distally were then  carefully trimmed using a #10 blade scalpel.  Irrigation using double  antibiotic solution.  The tourniquet was released.  Bleeders were  controlled using electrocautery.  The plantar surface of the proximal  metatarsal amputation site then were carefully smoothed using a Midwife.  There was no significant active bleeding present and the  medial incision site was then carefully approximated with interrupted  vertical mattress sutures of 2-0 Prolene.  The wound was left open  distally and packed with 4x4s soaked in normal saline.  Dry 4x4s and ABD  pads were then placed and fixed to the foot with Kerlix.  The patient  was then reactivated, extubated and returned to the recovery room in  satisfactory condition.  All instrument and sponge counts were correct.      Kerrin Champagne, M.D.  Electronically Signed     JEN/MEDQ  D:  03/07/2006  T:  03/07/2006  Job:  [id number]   cc:   Kerrin Champagne, M.D.

## 2010-07-28 NOTE — H&P (Signed)
NAME:  KONG, PACKETT             ACCOUNT NO.:  [id number]   MEDICAL RECORD NO.:  [id number]          PATIENT TYPE:  OIB   LOCATION:  2550                         FACILITY:  [facility]   PHYSICIAN:  Mobolaji B. Bakare, M.D.DATE OF BIRTH:  08-17-1955   DATE OF ADMISSION:  04/03/2004  DATE OF DISCHARGE:                                HISTORY & PHYSICAL   PRIMARY CARE PHYSICIAN:  Unassigned.   CHIEF COMPLAINT:  Hyperglycemia.   HISTORY OF PRESENTING COMPLAINT:  Mr. Lipsett is an unfortunate 55 year old  African-American male with recent diagnosis of diabetes mellitus in  November, 2005.  At that time he was also found to be hypertensive and he  had osteomyelitis of the right big toe.  He underwent amputation and was  discharged home on medications with prescriptions and instructed to followup  with a PCP.  However, because of patient's insurance he needs to pay to see  a PCP and he also cannot afford his medications; hence, he has been using  his medications irregularly and he has not been able to see a primary care  doctor.   I was called by Dr. August Saucer, orthopedic surgeon, regarding this patient that he  scheduled to have a day procedure done, amputation of the second toe.  I do  not have details of the operative findings at this time but I was informed  that it was infected.  At the time of preop evaluation he was found to have  a blood glucose of 632.  Hence, internal medicine will admit for management  of his blood glucose.   He was given subcu Novolin R 10 units preop and about 10 units postop for  high critical BG that was inrecordable on Glucometer.  Blood glucose at this  time is about 400.  Patient denies any symptoms, no fever, no pain, no  headaches.   REVIEW OF SYSTEMS:  Is negative for cough, chest pain, shortness of breath,  abdominal pain, diarrhea, constipation, vomiting.  The patient complained of  pins and needle sensation in both feet.   PAST MEDICAL HISTORY:  1.   Osteomyelitis right big toe.  2.  Hypertension.  3.  Diabetes type 2.  4.  Diabetic neuropathy.  5.  Microcytic anemia.   CURRENT MEDICATIONS:  Include:  1.  Hydrochlorothiazide 25 mg p.o. daily.  2.  Lisinopril 40 mg p.o. daily.  3.  Norvasc 5 mg p.o. daily.  4.  Glucotrol XL 5 mg p.o. daily.   ALLERGIES:  NO KNOWN DRUG ALLERGY.   PAST SURGICAL HISTORY:  Amputation right big toe November, 2005.   FAMILY HISTORY:  Patient is an orphan.  He was raised by his step-mother.  He has never been in contact with his parents.  He does not information  about them.  He knows he has got some siblings but he does not know who they  are and at the moment is unmarried.  He has no kids.   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  He is on  medical  insurance but this has several limitations.  He  does not smoke cigarettes.  He quit smoking about 7 years ago.  He smoked 1-1/2 packs for about 20  years.  He denies any alcohol abuse.  He quit street drugs many years ago.  He has tried marijuana and cocaine in the past.   CURRENT VITALS:  Blood pressure 150/80, heart rate 80, respiratory rate 16,  O2 sat 98% on room air.  Temperature 97.0.   EXAMINATION:  He is a pleasant African-American male not in respiratory  distress.  Normocephalic, atraumatic head.  Pupils equal, round and reactive  to light.  Extraocular muscle movement intact.  No carotid bruits, no  evidence of JVD, no thyromegaly.  Mucous membranes moist, no oral thrush.  LUNGS:  Clear clinically to auscultation.  CVS:  S1, S2 regular, no murmur, no gallop, no rub.  ABDOMEN:  Not distended, soft, nontender.  No palpable organomegaly.  Bowel  sounds present.  EXTREMITIES:  Dorsalis pedis pulses palpable bilaterally and equal  bilaterally.  He does have a good pulse on the right foot.  He is status  post surgery and right foot is with dressing.  No pedal edema.  SKIN:  No rash, no petechiae but there is evidence of healed  pruritic rash  on both lower extremities and upper extremities.   LABORATORY DATA:  None available at this time.   ASSESSMENT AND PLAN:  Mr. Careaga is an unfortunate 55 year old African-  American male with history of type 2 diabetes, hypertension, osteomyelitis  right big toe status post amputation, and now presenting with infected  second toe on the right.  He had amputation today and has been admitted for  hyperglycemia management.  1.  Uncontrolled diabetes mellitus.  Will admit and control blood glucose      with Glucommander.  Will start Glucotrol XL 5 mg p.o. daily.  Patient      would benefit from Lantus insulin when he comes off Glucommander.      However, this was an issue at the last admission because of his      insurance status, being able to afford this.  I have spoken to the      social worker and she would look at [employer] and other      possible means of getting financial assistance or medical assistance.      In any event, if it is unaffordable for him I would consider adding      metformin to the Glucotrol.  Check hemoglobin A1c.  Of note is that his      hemoglobin A1c at last admission in November, 2005, was 15.  Will check      urinalysis for protein, check BMET and CBC and differential.  2.  Osteomyelitis right second toe.  I suspect this is probably      osteomyelitis given the history, and patient is status post amputation.      Will start Zosyn 3.375 g IV q.6 h. for residual soft tissue infection      and this may be changed to Augmentin on discharge.  Obtain blood culture      before antibiotics.  Will use Dilaudid 0.5 to 1 mg IV q.4 h. p.r.n. for      severe pain and Vicodin 1-2 tablets p.o. q.4 h. p.r.n. for mild to      moderate pain.  3.  Diabetic neuropathy.  Start Neurontin 100 mg p.o. t.i.d.  4.  Hypertension, uncontrolled.  Pain may be an issue at this time  contributing to the high blood pressure.  If blood pressure still      uncontrolled after control of pain, will optimize medications.  As of      now, will continue with hydrochlorothiazide 25 mg p.o. daily, lisinopril      40 mg p.o. daily, Norvasc 5 mg p.o. daily.  5.  Social issues.  We asked social worker to evaluate regarding insurance,      medications and setting up PCP.  The      patient could possibly benefit from health serve.  6.  Will ask diabetic educator to see to reinforce diabetic teaching.  7.  Deep vein thrombosis prophylaxis.  Will use sequential compression      device.      MBB/MEDQ  D:  04/03/2004  T:  04/03/2004  Job:  [id number]   cc:   G. Dorene Grebe, M.D.  Fax: [phone]

## 2010-07-28 NOTE — Consult Note (Signed)
NAME:  Louis English, Louis English NO.:  [id number]   MEDICAL RECORD NO.:  [id number]          PATIENT TYPE:  INP   LOCATION:  5703                         FACILITY:  [facility]   PHYSICIAN:  Kerrin Champagne, M.D.   DATE OF BIRTH:  [birth year]   DATE OF CONSULTATION:  03/02/2006  DATE OF DISCHARGE:                                 CONSULTATION   ORTHOPEDIC DIAGNOSIS:  Right foot great toe neurotrophic ulcer  underlying the great toe metatarsal phalangeal joint with cellulitis  changes involving the medial forefoot, great toe and the second toe.   HISTORY OF PRESENT ILLNESS:  The patient, a 55 year old male patient of  Dr. Dorene Grebe who has seen him in the past, performed multiple  procedures on his right foot for diabetic peripheral neuropathy,  neurotrophic ulcers and osteomyelitis involving the great toe and second  toe, his most recent surgery in 2006.  He presents to the emergency room  today with neurotrophic ulcer, fever and chills.  Was admitted by  medicine service for evaluation and treatment.  Plain radiographs  demonstrating osteomyelitis changes involving the right second toe  metatarsal head.  The patient has been experiencing fevers and chills  over the last several days.  He has debrided his own great toe ulcer of  necrotic debris.  He does have a history of neurotrophic changes of the  foot with diabetic peripheral neuropathy, admits to anesthesia involving  the distal half of the foot.   EXAMINATION:  Right foot shows a 4 cm square ulcer underlying the great  toe metatarsal phalangeal joint.  It is grade 3 or 4 and extends down to  tendon; no bone is exposed.  There are over-hanging areas of necrotic  tissue involving the borders and the base of the ulcer.  The second  metatarsal head with edema and soft tissue swelling apparent.  X-rays  were reviewed.  There is soft tissue gas which may be related to the  ulcer under the great toe, but also over the dorsal  aspect of the second  metatarsal head.  The patient also noted on clinical exam to have four  or five retained nylon sutures which apparently relate to his most  recent surgery of 2006.  During evaluation, the patient underwent  painting of the right foot with Betadine and debridement of necrotic  edges, a large open neurotrophic ulcer underlying the great toe.  No  abscess noted there.  There was fluctuance over the second metatarsal  head.  This was lanced with a scalpel, did not show any drainage of any  significance, however.  Therefore, the areas were packed with saline wet-  to-dry dressing.  An MRI scan has been ordered and will await the  results of the MRI before proceeding with operative debridement.   IMPRESSION:  Right great toe grade 3 or grade 4 diabetic neurotrophic  ulcer with rim of necrotic tissue, forefoot cellulitis, radiographic  changes suspicious for osteomyelitis of the second metatarsal head.   PLAN:  No abscess with debridement.  Will need formal debridement in the  operating room.  Will schedule this  for December 23.  Will keep him  n.p.o. past midnight.  Will await the results of the MRI scan in order  to plan for the surgical procedure.      Kerrin Champagne, M.D.  Electronically Signed     JEN/MEDQ  D:  03/03/2006  T:  03/04/2006  Job:  [id number]

## 2010-07-28 NOTE — Op Note (Signed)
NAME:  [name], [name] NO.:  [id number]   MEDICAL RECORD NO.:  [id number]          PATIENT TYPE:  INP   LOCATION:  5738                         FACILITY:  [facility]   PHYSICIAN:  Balinda Quails, M.D.    DATE OF BIRTH:  [birth year]   DATE OF PROCEDURE:  03/08/2006  DATE OF DISCHARGE:                               OPERATIVE REPORT   SURGEON:  Denman George, MD.   DIAGNOSES:  1. Peripheral vascular disease.  2. Open poorly healing wound right foot.   PROCEDURE:  1. Abdominal aortogram.  2. Selective right lower extremity arteriogram.  3. Retrograde left femoral arteriogram.  4. StarClose left common femoral artery.   CONTRAST:  110 mL Visipaque.   ACCESS:  Left common femoral artery 5-French sheath.   COMPLICATIONS:  None apparent.   CLINICAL NOTE:  [name] is a 55 year old African-American  male with a history of poorly-controlled type 2 diabetes, renal  insufficiency, hypertension and peripheral vascular disease.  He has  undergone right great toe amputation secondary to osteomyelitis with  drainage of an abscess and debridement of the foot.  He has had poor  healing with evidence of depressed ankle brachial indices measuring 0.63  on the right.  He is brought to the cath lab, at this time, for  diagnostic arteriography.   PROCEDURE NOTE:  The patient brought to the cath lab in stable  condition; placed in the supine position; both groins prepped and draped  in a sterile fashion.  The patient was noted be severely hypertensive  with systolic pressure greater than 200, and diastolic blood pressure  greater than 100.  He was administered antihypertensive medications;  including labetalol and hydralazine intravenously.  He also received  6.25 mg of Phenergan intravenously.   Skin and subcutaneous tissue of the left groin instilled with 1%  Xylocaine.  An 18-gauge needle was introduced into the left common  femoral artery; a 0.035 Wholey  guidewire was advanced through the needle  into the mid abdominal aorta.  A pigtail catheter was advanced over the  guidewire.  An AP abdominal aortogram was obtained.  This revealed  widely patent single bilateral renal arteries.  Intact infrarenal aorta  without evidence of significant stenosis.   The Wholey guidewire was then used exchanged for a crossover catheter.  This was engaged into the right common iliac artery.  Right iliac  arteriography obtained in the LAO projection.  This revealed a widely  patent left common iliac artery, hypogastric and external iliac  arteries.   Dictation ended at this point.      Balinda Quails, M.D.  Electronically Signed     PGH/MEDQ  D:  03/08/2006  T:  03/09/2006  Job:  [id number]

## 2010-07-28 NOTE — Op Note (Signed)
NAME:  [name], [name] NO.:  [id number]   MEDICAL RECORD NO.:  [id number]          PATIENT TYPE:  INP   LOCATION:  5033                         FACILITY:  [facility]   PHYSICIAN:  Burnard Bunting, M.D.    DATE OF BIRTH:  10-Apr-1955   DATE OF PROCEDURE:  01/28/2004  DATE OF DISCHARGE:                                 OPERATIVE REPORT   PREOPERATIVE DIAGNOSIS:  Right great toe infection.   POSTOPERATIVE DIAGNOSIS:  Right great toe infection.   OPERATION PERFORMED:  Right great toe amputation to the midportion of the  proximal phalanx.   SURGEON:  Burnard Bunting, M.D.   ANESTHESIA:  General endotracheal.   ESTIMATED BLOOD LOSS:  Minimal.  Ankle Esmarch utilized for approximately 10  minutes.   DESCRIPTION OF PROCEDURE:  The patient was brought to the operating room  where ankle block and MAC anesthesia was induced.  Right ankle and foot was  prepped with Hibiclens and saline and draped in a sterile manner.  Ankle  Esmarch was utilized. A fish mouth incision was made on the right great toe.  The incision was made approximately 1 cm proximal to healthy viable tissue.  Fish mouth incision was made circumferentially around the toe.  After  periosteal skeletonization, the proximal shaft was cut approximately 1 cm  below the skin incision. The toe in this manner was removed. The incision  was thoroughly irrigated.  Soft tissue coverage was obtained over the bone  by reapproximating the capsule with single 2-0 Vicryl suture over the cut  bony edges. The skin was then closed after ankle Esmarch released using 3-0  nylon suture.  Some but minimal bleeding was noted from the skin edges.  Bulky dressing was then applied to the foot.  The patient tolerated the  procedure well without immediate complications.       GSD/MEDQ  D:  01/30/2004  T:  01/31/2004  Job:  [id number]

## 2010-07-28 NOTE — Op Note (Signed)
NAME:  Louis English, Louis English NO.:  [id number]   MEDICAL RECORD NO.:  [id number]          PATIENT TYPE:  INP   LOCATION:  5703                         FACILITY:  [facility]   PHYSICIAN:  Kerrin Champagne, M.D.   DATE OF BIRTH:  02-Jan-1956   DATE OF PROCEDURE:  03/03/2006  DATE OF DISCHARGE:                               OPERATIVE REPORT   PREOPERATIVE DIAGNOSES:  Osteomyelitis, right great toe, remnant of  proximal phalanx, infected right great toe, plantar neurotrophic ulcer  underlying the great toe metatarsophalangeal joint, and soft tissue  cellulitis involving the medial forefoot.   POSTOPERATIVE DIAGNOSES:  Right second metatarsal head osteomyelitis,  right great toe proximal phalanx osteomyelitis, overlying dorsal distal  abscess involving the medial right great toe and second toe.   PROCEDURES:  Incision, drainage and debridement of right forefoot  abscess with a second metatarsal head resection and open amputation of  right great toe proximal phalanx.   SURGEON:  Kerrin Champagne, M.D.   ASSISTANT:  None.   ANESTHESIA:  General via oral obturator, Quita Skye. Krista Blue, M.D.   SPECIMENS:  Anaerobic and aerobic cultures and sensitivities, Gram  stains performed.   DISPOSITION OF PATHOLOGY SPECIMENS:  To pathology.   ESTIMATED BLOOD LOSS:  15 cc.   COMPLICATIONS:  None.   TOTAL TOURNIQUET TIME:  0 minutes.   DISPOSITION:  The patient returned to the PACU in good condition.   HISTORY OF PRESENT ILLNESS:  This patient is a 55 year old male, who has  undergone previous right foot procedures by Dr. Dorene Grebe in the past,  the last procedure in 2006.  The patient presented to the emergency room  yesterday, 12/22, with an ulcer underlying the right great toe.  He is  status post great toe resection to the level of the proximal phalanx and  a second toe resection in the past.  He was febrile with an elevated  white cell count and a history of diabetes mellitus, with  diabetic  retinopathy and diabetic peripheral neuropathy.  He underwent initial  debridement at bedside last evening with I&D of the second toe.  He  underwent MRI study, which demonstrated increased uptake of the great  toe proximal phalanx, consistent with osteomyelitis, and equivocal  changes involving the second toe metatarsophalangeal joint, with abscess  overlying the right second toe metatarsal head.  He is status post  resection of the second toe to the level of the metatarsophalangeal  joint.   INTRAOPERATIVE FINDINGS:  The patient was found to have abscess  overlying the distal dorsal aspect of the right second toe metatarsal  head.  He was found to have communication between this and the  underlying neurotrophic ulcer and osteomyelitis involving the great toe  proximal phalanx.   DESCRIPTION OF PROCEDURES:  After adequate general anesthesia, the right  lower extremity was prepped with Betadine Scrub and Prep Solution and  draped in the usual manner.   The incision initially started with the second toe, as evaluation of the  second toe showed purulent drainage from the previous I&D place from  last evening.  The patient  had an incision along the previous incision  scar for resection of the second toe.  This was then opened over the  dorsal aspect of the second to be metatarsal head in longitudinal  fashion.   A large abscess was encountered and this was debrided, debriding soft  tissue circumferentially about the abscess, then resecting the  metatarsal head.  A racket-shaped incision was then carried over the  base of the great toe proximal phalanx, resecting the great toe proximal  phalanx, as well as a large neurotrophic ulcer over the plantar medial  aspect of the great toe metatarsophalangeal joint.  The entire ulcer was  resected and the proximal phalanx disarticulated from the great toe  metatarsophalangeal joint.  The ulcer appeared to only extend down to  the  level of the metatarsophalangeal joint.  Its edges were then further  debrided of any necrotic debris.  Two small areas of residual cellulitis  were left intact, this over the dorsal aspect of the foot between the  second and the third metatarsal head region, as well as over the plantar  flap area, a very small area of residual.  The wound was cauterized  where active bleeding was present using electrocautery.  Once the  bleeding was controlled, then irrigation was performed using 3000 cc of  normal saline solution, then double antibiotic 500 cc.  The open wound  was then packed with double antibiotic-soaked Kerlix in wet-to-dry  fashion with 4 x 4's and ABD pads fixed to the foot with Kerlix.  And  then a sleeve of stockinette was placed to hold it in place.  The  patient was then reactivated, extubated and returned to the recovery  room in satisfactory condition.  Note that cultures were obtained from  the area of abscess over the dorsal aspect of the second toe.  Additionally, bone material from the second toe metatarsal head was sent  for culture and sensitivity.  Anaerobic and aerobic cultures were  obtained.      Kerrin Champagne, M.D.  Electronically Signed     JEN/MEDQ  D:  03/03/2006  T:  03/04/2006  Job:  [id number]

## 2010-07-28 NOTE — Op Note (Signed)
NAME:  Louis English, Louis English NO.:  [id number]   MEDICAL RECORD NO.:  [id number]          PATIENT TYPE:  INP   LOCATION:  5738                         FACILITY:  [facility]   PHYSICIAN:  Balinda Quails, M.D.    DATE OF BIRTH:  October 03, 1955   DATE OF PROCEDURE:  DATE OF DISCHARGE:                               OPERATIVE REPORT   ADDENDUM   Previous note was terminated, its dictation number was [phone] this is a  continuation.   A guidewire was then reinserted through the crossover catheter.  The  crossover catheter removed and an end-hole catheter passed down into the  right external iliac artery.  Right lower extremity selective  arteriography obtained.  This revealed a widely patent external iliac  and common femoral arteries.  The profunda femoris artery was intact.  The right superficial femoral artery was widely patent down to the  popliteal artery.  The popliteal artery was also widely patent.  There  was severe tibial vessel occlusive disease.  Occlusion of all 3 tibial  arteries present in the proximal and mid calf.  There was reconstitution  of the anterior tibial artery above the ankle joint which continued into  the foot via the dorsalis pedis artery.  A small posterior tibial artery  reconstituted at the right ankle joint.   The end-hole catheter was then removed.  A retrograde injection then  made through the left femoral sheath, verifying position in the left  common femoral artery.  A StarClose guidewire was then inserted through  the sheath.  The sheath removed; and the StarClose advanced over the  guidewire.  The guidewire removed.  The StarClose device then clicked  into place; brought back and the StarClose staple was deployed; and  brought back to the arterial wall.  This was then deployed into the  arterial wall without incident.  There were no apparent complications.   FINAL IMPRESSION:  1. Single widely patent bilateral renal arteries.  2. Severe  tibial vessel occlusive disease.  Right lower extremity with      occlusion of all 3 tibial arteries and reconstitution of a dominant      anterior tibial artery, and small posterior tibial artery above the      ankle.   DISPOSITION:  These results will be reviewed further, discussed with the  patient who may be a candidate for a anterior tibial bypass for limb  salvage.      Balinda Quails, M.D.  Electronically Signed     PGH/MEDQ  D:  03/08/2006  T:  03/09/2006  Job:  [id number]

## 2010-07-28 NOTE — Discharge Summary (Signed)
Louis English, SAYEGH NO.:  [id number]   MEDICAL RECORD NO.:  [id number]          PATIENT TYPE:  INP   LOCATION:  3039                         FACILITY:  [facility]   PHYSICIAN:  Beatrix Fetters, M.D.     DATE OF BIRTH:  07-06-1955   DATE OF ADMISSION:  03/02/2006  DATE OF DISCHARGE:  03/15/2006                               DISCHARGE SUMMARY   ADDENDUM:   DISCHARGE MEDICATIONS:  Include:  1. Aspirin 325 mg 1 tablet daily.  2. Atenolol 50 mg 1 tablet daily.  3. Glipizide 10 mg 1 tablet daily with breakfast.  4. Lisinopril 40 mg 1 tablet daily.  5. Multivitamin 1 tablet daily.  6. Vicodin 5/500 one tablet q.6 hours p.r.n. pain.  7. Dulcolax 10 mg daily p.r.n. constipation.  8. Colace 100 mg twice daily p.r.n. constipation.   FOLLOWUP:  The patient is to follow up with Dr. Hart Rochester of  cardiovascular/thoracic surgery on April 09, 2006 at 11 a.m.  He is  also to follow up with Dr. Otelia Sergeant of orthopedics on March 20, 2006 at 9  a.m.  He is to follow up with Dr. Okey Dupre in the [facility] on Friday, March 22, 2006 at 2:15 p.m.  At that time he will  receive a BMET.      Beatrix Fetters, M.D.  Electronically Signed     CA/MEDQ  D:  03/15/2006  T:  03/15/2006  Job:  [id number]

## 2010-07-28 NOTE — Op Note (Signed)
NAME:  Louis English, Louis English NO.:  [id number]   MEDICAL RECORD NO.:  [id number]          PATIENT TYPE:  INP   LOCATION:  5703                         FACILITY:  [facility]   PHYSICIAN:  Kerrin Champagne, M.D.   DATE OF BIRTH:  [birth year]   DATE OF PROCEDURE:  DATE OF DISCHARGE:                               OPERATIVE REPORT   Audio too short to transcribe (less than 5 seconds)      Kerrin Champagne, M.D.     JEN/MEDQ  D:  03/07/2006  T:  03/07/2006  Job:  [id number]

## 2010-07-28 NOTE — Op Note (Signed)
NAME:  Louis English, Louis English NO.:  [id number]   MEDICAL RECORD NO.:  [id number]          PATIENT TYPE:  OIB   LOCATION:  5028                         FACILITY:  [facility]   PHYSICIAN:  Burnard Bunting, M.D.    DATE OF BIRTH:  Oct 16, 1955   DATE OF PROCEDURE:  04/03/2004  DATE OF DISCHARGE:  04/05/2004                                 OPERATIVE REPORT   PREOPERATIVE DIAGNOSIS:  Right second toe osteomyelitis and infection.   POSTOPERATIVE DIAGNOSIS:  Right second toe osteomyelitis and infection.   PROCEDURE:  Right second toe amputation to the metatarsophalangeal joint.   SURGEON:  Burnard Bunting, M.D.   ASSISTANT:  None.   SPECIMENS:  Right toe to pathology.   PROCEDURE IN DETAIL:  The patient is brought to the operating room where LMA  anesthesia was induced.  An ankle Esmarch was utilized without wrapping up  the foot.  The foot was prepped with Hibiclens and saline and draped in a  sterile manner.  A fish mouth-type incision was made around the base of the  right second toe.  The skin and subcutaneous tissue was sharply divided down  to the bone circumferentially.  The periosteal elevation was used to elevate  soft tissue in capsule off of the base of the proximal phalanx.  The MTP was  then and the toe was then removed.  The incision was thoroughly irrigated  and closed by first re-approximating the capsular tissue over the metatarsal  head.  The skin was then closed using 3-0 nylon suture.  The ankle Esmarch  was released after approximately 10 minutes.  The bleeding points  encountered were controlled using the bipolar electrocautery prior to  closure.  Some marginal bleeding was observed at the skin edges.  The  patient tolerated the procedure well.  A soft dressing was placed.      GSD/MEDQ  D:  04/12/2004  T:  04/12/2004  Job:  [id number]

## 2010-07-28 NOTE — Consult Note (Signed)
NAME:  Louis English, GULINO NO.:  [id number]   MEDICAL RECORD NO.:  [id number]          PATIENT TYPE:  INP   LOCATION:  5033                         FACILITY:  [facility]   PHYSICIAN:  Burnard Bunting, M.D.    DATE OF BIRTH:  05-Mar-1956   DATE OF CONSULTATION:  DATE OF DISCHARGE:                                   CONSULTATION   REQUESTING PHYSICIAN:  Dr. August Luz   CHIEF COMPLAINT:  Right great toe infection.   HISTORY OF PRESENT ILLNESS:  Louis English is a 55 year old patient who  describes a several day history of right toe drainage.  His right great toe  toenail came off on the day of admission.  The patient stated that it  started smelling badly on the day of admission.  The patient denies any pain  in the right great toe.  He denies any fever or chills.  He does report some  paraesthesias in bilateral lower extremities, but not his hands.  He denies  any nausea.  The patient has no known history of diabetes.   PAST MEDICAL HISTORY:  None.  He has not seen a physician in many years.   ALLERGIES:  None.   CURRENT MEDICATIONS:  None.   SOCIAL HISTORY:  Patient has smoked cigarettes.  He stopped seven years ago.  He stopped drinking in 1979.  Does have a history of marijuana and cocaine  use in the 1970s.  Patient works at [employer].  The  patient is adopted, but does live here in [place].   REVIEW OF SYSTEMS:  14 other systems are reviewed and are negative.   PHYSICAL EXAMINATION:  VITAL SIGNS:  Temperature 98, blood pressure 120/70,  heart rate 76, respirations 18.  GENERAL:  The patient responded to questions appropriately.  The patient is  in no apparent distress.  He has normal body mass index.  EXTREMITIES:  The patient's right foot demonstrates anaerobic odor with  destructive skin changes into the distal right great toe.  There is some  early darkening of the skin in the distal aspect of the second toe, but no  obvious drainage or  erythema in that region.  Patient has trace palpable  pedal pulses on the right, palpable pulses on the left.  He has good  dorsiflexion, plantar flexion, inversion, eversion strength, but decreased  sensation on dorsi/plantar aspect of the foot consistent with stocking/glove  neuropathy bilaterally.  There is no proximal lymphadenopathy no new  effusions bilaterally.  Hip range of motion is nontender.  There is no other  erythema or fluctuance in the mid foot dorsally or on the plantar surface.  The right second toe has early distal skin necrotic changes with clubbing of  the toenail.   LABORATORIES:  White count 10.2, platelets 371, hematocrit 32.2.  Sodium  126, glucose 420 on admission, BUN and creatinine 13 and 1.1.  Albumin 2.7.  Radiographs demonstrate distal phalanx osteomyelitis.  ABI on the right  demonstrates mild reduction in arterial flow with ABI of 0.79.   IMPRESSION:  Right great toe osteomyelitis with anaerobic and aerobic  infection.  Gram stain of this demonstrates moderate gram-positive cocci and  gram-positive rods and gram-negative rods.   PLAN:  Patient will need MCP amputation of the right great toe.  His second  toe is also at risk.  Further medical management is in progress.  Will plan  for surgery later on today.  The risks and benefits of surgery were  discussed with patient.  They include, but are not limited to, recurrent  infection requiring higher amputation level.  Patient understands.  Opportunity for questions was presented.       GSD/MEDQ  D:  01/27/2004  T:  01/27/2004  Job:  [id number]

## 2010-07-28 NOTE — Discharge Summary (Signed)
NAMEJAMORIAN, DIMARIA NO.:  [id number]   MEDICAL RECORD NO.:  [id number]          PATIENT TYPE:  INP   LOCATION:  2012                         FACILITY:  [facility]   PHYSICIAN:  Yvonne Kendall, M.D.  DATE OF BIRTH:  1955/03/19   DATE OF ADMISSION:  03/02/2006  DATE OF DISCHARGE:                               DISCHARGE SUMMARY   DATE OF ADMISSION:  March 02, 2006   DATE OF DISCHARGE:   DISCHARGE DIAGNOSES:  1. 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  2. Type 2 diabetes with hemoglobin A1c of 9.3.  3. Hypertension.  4. Microcytic anemia.  5. Mental retardation.  6. Acute on chronic renal insufficiency with baseline creatinine of      1.4.  7. Microscopic hematuria.  8. Hyponatremia secondary to dehydration - Resolved.  9. Hypoalbuminemia.  10.Peripheral vascular disease, status post right popliteal to distal      anterior tibial bypass.  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  12.Diabetic neuropathy.  13.Prostate nodule of unknown significance with normal PSA.   DISCHARGE MEDICATIONS:  1. Vicodin 500/5 1 tab p.o. q. 6 hours p.r.n. pain.  2. Aspirin 325 mg p.o. daily.  3. Atenolol 50 mg p.o. daily.  4. Glipizide 10 mg p.o. daily.  5. Lisinopril 20 mg p.o. daily.  6. Multivitamin 1 tab p.o. daily.   DISPOSITION AND FOLLOWUP:  At the time of discharge, the patient was in  stable condition.  He was afebrile and his white count had returned to  normal.  He will be discharged to a group home or assisted living  facility, given his lack of social contacts in the area and difficulty  performing complex activities of daily living.  He will be scheduled for  a hospital followup in the [facility] prior to  discharge and this information will be included in the addendum to this  discharge summary.  At the time of followup, his electrolytes and renal  function should be monitored  with a basic metabolic panel.  Additionally, further titration of his antihypertensive and diabetic  medications should be performed.  He will also need to be referred to an  ophthomologist.  Wound care will be performed by home health nursing or  the assisted living staff, which should include wet-to-dry dressing  changes over the surgical wound on his right foot.  The patient will  also follow up with Dr. Otelia Sergeant and Dr. Hart Rochester per their discharge  instructions.   PROCEDURES PERFORMED:  1. Three-view plain film of the right foot:  This was performed on      March 02, 2006, and revealed osteomyelitis of the second      metatarsal head, as well as marked soft tissue swelling and soft      tissue gas in the region of the distal first and second      metatarsals.  2. MRI of the right foot:  This was performed on March 03, 2006,      and revealed severe cellulitic change involving the distal foot  with marked soft tissue swelling and modest inflammatory changes      without discrete drainable abscess.  Findings consistent with      osteomyelitis involving the proximal phalanx of the great toe are      also seen.  3. Renal ultrasound:  This was performed on March 03, 2006, to      evaluate for potential obstruction leading to acute renal failure.      No hydronephrosis was noted, but the kidneys appeared slightly      echogenic.  This is suggestive of chronic renal medical disease.  4. Arterial Dopplers of the lower extremities:  This was performed on      March 05, 2003, and revealed an ankle brachial index of 0.68 in      the left and right lower extremities.  This result suggests tibial      vessel disease.  5. Right lower extremity arteriogram:  This was performed by Dr. Liliane Bade on March 08, 2006, and revealed severe tibial vessel      occlusive disease of the right lower extremity with occlusion of      all 3 tibial arteries and reconstitution of the dominant  anterior      tibial artery and small posterior tibial artery above the ankle.  6. Right lower extremity arteriogram:  This was performed on March 11, 2006, following a right popliteal to anterior tibial bypass.      The vein graft was viewed as patent to the distal anterior tibial      artery.  7. Incision, drainage, and debridement of right forefoot abscess with      a second metatarsal head resection and open amputation of the right      great toe proximal phalanx:  This was performed on March 03, 2006, by Dr. Vira Browns and was tolerated without difficulty by      the patient.  8. Revision of right great toe metatarsal phalangeal joint amputation      to a first ray deletion at the first metatarsal base with repeat      irrigation and debridement of the second metatarsal head abscess:      This was performed on March 07, 2006, by Dr. Vira Browns.  It      was tolerated without difficulty by the patient.  9. Right popliteal to distal anterior tibial bypass using a      nonreversed translocated saphenous vein graft to the right leg with      intraoperative arteriogram:  This was performed on March 11, 2006, by Dr. Josephina Gip.  The patient tolerated this procedure      without any significant complications and as noted above, blood      flow through the vein graft was adequate.   CONSULTATIONS:  1. Dr. Vira Browns, [facility].  2. Dr. Liliane Bade, [facility].  3. Dr. Josephina Gip, [facility].   BRIEF ADMITTING HISTORY AND PHYSICAL:  Mr. Sauceda is a 55 year old  gentleman with poorly controlled type 2 diabetes and previous  amputations of the right great and second toes, who presented to the  emergency room complaining of generalized malaise, fatigue and a  draining ulceration on the tip of his right foot, which began  approximately 1 month prior to presentation.  He endorsed having chills, but denied having fevers, sweats or any significant weight  changes.  He  has been lost to medical followup for approximately 1-2 years and has  not been seeking any medications for at least 1-2 months.   PHYSICAL EXAM:  Temperature 99.1, blood pressure 128/71, pulse 107,  respirations 20, oxygen saturation 100% on room air.  GENERAL:  The patient is a poorly groomed gentleman in no acute  distress.  HEENT:  Eyes are nonconvergant.  The right pupil reacts sluggishly to  light; the left pupil is minimally reactive.  Extraocular eye movements  are intact.  Funduscopy reveals bilateral cataracts and changes with  consistent diabetic retinopathy.  The patient's eye right visual acuity  is approximately 20/100.  His left eye visual acuity is unmeasurable as  the patient can only see large shapes on the right.  Patient has poor  dentition.  His oropharynx is mildly erythematous with postnasal drip.  NECK:  Thin without lymphadenopathy or thyromegaly.  RESPIRATORY:  Lungs are clear to auscultation bilaterally with good air  movement.  CARDIOVASCULAR:  Patient has a regular rate and rhythm with a soft S4.  No murmurs or rub are appreciated.  ABDOMEN:  Decreased bowel sounds are noted.  The abdomen is soft,  nontender, but with mild hepatomegaly with the liver extending  approximately 4 cm below the right costal margin.  The patient has good rectal tone.  A minimal amount of soft brown stool  is found in the rectal vault.  It is fecal occult blood negative.  EXTREMITIES:  The right lower extremity has mild swelling.  A 3-4 cm  round ulceration at the base of the right toe amputation with dusky  tissue is seen.  It can probed to bone.  Patient has trace dorsalis  pedis and an absent posterior tibial pulse on the right foot.  He has 2+  posterior tibial and dorsalis pedis pulses on the left side.  GU:  No costovertebral angle tenderness is noted.  The patient has a  slightly enlarged prostate with a firm nodular in the left lobe of  prostate.  SKIN:   Patient's skin is dry and scaly.  NEURO:  Cranial nerves II-XII are intact.  He has 5/5 strength in the  upper and lower extremities bilaterally.  He has absent fine touch in  his right lower extremity extending up to the mid thigh.  His fine touch  sensation is absent in the left lower extremity up to his knee.  PSYCH:  Patient is alert and oriented x3, although he appears to have  mild cognitive delay.  He has an appropriate mood and affect.   ADMISSION LABS:  White blood cell count 17, hemoglobin 10, platelets  261, ANC 15.8, MCV 80. Sodium 126, potassium 4.5, chloride 98,  bicarbonate 20, BUN 48, creatinine 2.2, glucose 187.  Inion gap 8, total  bilirubin 0.7, alkaline phosphatase 75, AST 28, ALT 22, total protein  7.3, albumin 2.6, calcium 8.4.  Urinalysis remarkable for moderate  blood, 100 protein, 3-6 red blood cells.   HOSPITAL COURSE:  1. Right foot diabetic foot ulcer with osteomyelitis:  On inspection,     the patient's right foot ulcer appeared to be quite severe.  This      was confirmed by the plain film and MRI studies.  The patient was      started on empiric antibiotics with vancomycin and Zosyn.  He was      also evaluated by Dr. Otelia Sergeant of [facility], who performed      debridement and amputation  of the wound and involved bony      structures in the patient's foot.  He tolerated these procedures      well and was continued on antibiotic therapy for a total of 13      days.  At this time, his white blood cell count has normalized and      he has remained afebrile throughout the admission.  He has to have      daily wound care with b.i.d. wet-to-dry dressing changes as      directed by Dr. Otelia Sergeant.  Additionally, tight glycemic control and      appropriate physical therapy is imperative to promote good healing      of the patient's foot.   1. Peripheral vascular disease:  Ankle brachial index studies were      performed to evaluate blood flow to the patient's  lower      extremities.  These revealed significant disease, which would      impair healing of his right foot infection.  Arteriogram confirmed      significant tibial vessel disease and the patient subsequently      underwent a popliteal to anterior tibial bypass by [facility].  He      tolerated this procedure well.  Further monitoring will be      performed by [facility] as needed on an outpatient basis.   1. Diabetes mellitus:  The patient's blood sugars were initially      elevated, most likely secondary to his acute infection.  He was      started on sliding scale and Lantus insulin with good control of      his sugars.  He was then transitioned to glipizide in order to      improve the ease of therapy when he is discharged.  His sugars were      very well controlled with this alone and he is to continue this      after discharge.  Further titration as necessary will be performed      he returns to the outpatient clinic.  Additionally, he will need      evaluation by an ophthomologist for likely cataracts and diabetic      retinopathy.   1. Hypertension:  The patient has a history of hypertension but was      not taking his medications at the time of presentation.  He was      started metoprolol for blood pressure control and to reduce his      perioperative mortality.  This was changed to lisinopril following      debridement of his right foot ulcer because he was also found to      have microscopic proteinuria.  However, his blood pressure was not      adequately controlled and thus, atenolol was added to his blood      pressure regimen.  At the time of discharge, his blood pressures      were mildly elevated.  Further adjustments will be deferred to his      hospital followup.  At that time, if his creatinine has remained      stabe , an increase in the lisinopril may be considered.   1. Anemia:  On admission, the patient's hemoglobin was mildly     decreased at 10.6.  His MCV was 80,  suggesting a possible      microcytic anemia.  However, an iron panel was obtained and was  consistent with anemia of chronic disease.  This was most likely      due to his smoldering foot infection and renal insufficiency.  As      he was asymptomatic for the majority of the admission, he did not      receive any blood products until he underwent his popliteal-tibial      bypass.  He was given 1 unit of packed red blood cells without      difficulty.  His hemoglobin will continued to be monitored until he      is discharged and should be followed periodically as an outpatient.      If he continues to have a worsening anemia, in light of his renal      insufficiency, he may be a candidate for erythropoietin therapy at      some point in the future.  It should be noted that his B12 and      folate levels were normal.  His ferritin was 201, which in the      setting of an acute infection is not specific.   1. Acute on chronic renal insufficiency:  At the time of admission,      the patient's creatinine was elevated at 2.2.  At the time of his      previous discharge in January of 2006, his creatinine was 1.2.  A      renal ultrasound revealed chronic disease and a urine sodium was      quite low at 17.  Thus, it was felt that his acute renal failure      was secondary to pre-renal azotemia overlying chronic renal      insufficiency secondary to diabetes and hypertension.  He was      hydrated aggressively and his creatinine trended down to and      remained stable at 1.4.  His creatinine should continue to be      monitored, particularly when adjustments to his ACE inhibitor are      made.   1. Prostate nodule:  On physical exam, the patient was found to have a      mildly enlarged prostate with a nodule in the left lobe.  A PSA      obtained at the time of admission was normal at 0.54.  Thus, the      potential for prostatic malignancy is qute low.  Nonetheless given      the  patient's age, African-American race and prostate exam, routine      screening should be performed, including possibly monitoring his      PSA serially to look for rising levels.   1. Mild mental retardation:  The patient has a history of mental      retardation, though he reports that he graduated from [employer] in the 1970s.  He has been living alone in [place] and is      employed part-time at [employer].  In the past, he has applied for      disability, but was denied.  His current social support system is      quite poor, and thus the patient will be discharged to a group home      or assisted living facility so that he will      have the resources necessary to manage his chronic medical      conditions and to facilitate healing of his right lower extremity  wounds.  The patient is also currently applying for Medicaid to      assist with his medical expenses.      Yvonne Kendall, M.D.  Electronically Signed     CE/MEDQ  D:  03/13/2006  T:  03/13/2006  Job:  [id number]   cc:   Kerrin Champagne, M.D.  Balinda Quails, M.D.  Quita Skye Hart Rochester, M.D.  Eliseo Gum, M.D.  Cliffton Asters, M.D.

## 2010-07-28 NOTE — Op Note (Signed)
NAME:  Louis English, Louis English NO.:  [id number]   MEDICAL RECORD NO.:  [id number]          PATIENT TYPE:  INP   LOCATION:  2550                         FACILITY:  [facility]   PHYSICIAN:  Quita Skye. Hart Rochester, M.D.  DATE OF BIRTH:  [birth year]   DATE OF PROCEDURE:  03/11/2006  DATE OF DISCHARGE:                               OPERATIVE REPORT   PREOP DIAGNOSIS:  Ischemic right foot with open first toe amputation  with severe tibial occlusive disease.   POSTOPERATIVE DIAGNOSIS:  Ischemic right foot with open first toe  amputation with severe tibial occlusive disease.   OPERATIONS:  Right popliteal to distal anterior tibial bypass using a  nonreversed translocated saphenous vein graft to right leg with  intraoperative arteriogram.   SURGEON:  Dr. Hart Rochester.   FIRST ASSISTANT:  Gershon Crane.   ANESTHESIA:  General endotracheal.   PROCEDURE:  The patient was taken to the operating room, placed in the  supine position at which time satisfactory general endotracheal  anesthesia was administered.  The right leg was prepped with Betadine  scrubbing solution and draped in a routine sterile manner.  Anterior  tibial artery was exposed proximal to the flexion crease where it was a  relatively normal-appearing vessel.  It had some posterior plaque  formation but was widely patent on the angiogram at this point.  Saphenous vein was then exposed at the saphenofemoral junction and  removed down to the proximal calf through multiple incisions along the  medial aspect of the right leg.  Its branches were ligated with 4-0 and  5-0 silk ties and divided.  It was removed, gently dilated with  heparinized saline and marked for orientation purposes.   The vein had a few areas which were slightly thickened and did not  dilate as well as the others but it was an adequate vein.  The popliteal  artery was exposed below the knee through the vein harvesting incision.  It was a soft vessel with an excellent  pulse.  The tunnel was then  created along the course of the anterior tibial artery through the  interosseous membrane and a lateral counterincision was made over the  anterior compartment and then a tunnel made between this area and the  distal wound.  The patient was heparinized.  Popliteal artery occluded  proximally and distally with vessel loops, opened with a 15 blade and  extended with Potts scissors.  The proximal end of the vein was  spatulated and anastomosed end-to-side with 6-0 Prolene.   Following this the valves were rendered incompetent using a retrograde  valvulotome with resultant excellent flow out of the distal end of the  vein graft.  The vein was carefully delivered through the interosseous  membrane tunnel and down the lateral aspect of the leg into the distal  wound.  The anterior tibial artery was occluded with vessel loops,  opened with a 15 blade, extended with Potts scissors.  It would accept a  2.5 mm dilator distally.  The vein was carefully measured and spatulated  and anastomosed end-to-side with 6-0 Prolene.  Vesseloops were then  released.  There was an excellent pulse in the vein graft and excellent  Doppler flow in the foot.  Intraoperative arteriogram revealed a widely  patent anastomosis with runoff into the anterior tibial artery into the  plantar arch rather.  Protamine was given to reverse the heparin.  Following adequate  hemostasis the wounds were irrigated with saline, closed in layers with  Vicryl in a subcuticular fashion with Steri-Strips.  Sterile dressing  applied.  The patient taken to the recovery room in satisfactory  condition.           ______________________________  Quita Skye Hart Rochester, M.D.     JDL/MEDQ  D:  03/11/2006  T:  03/11/2006  Job:  [id number]

## 2011-03-15 DIAGNOSIS — E1159 Type 2 diabetes mellitus with other circulatory complications: Secondary | ICD-10-CM | POA: Diagnosis not present

## 2011-03-22 IMAGING — CR DG CHEST 1V PORT
1 series · 1 of 1 positions shown · non-contrast
Comparison: Chest radiograph performed 11/29/2009

CLINICAL DATA: Hypoglycemia.

PORTABLE CHEST - 1 VIEW

[view not recorded]
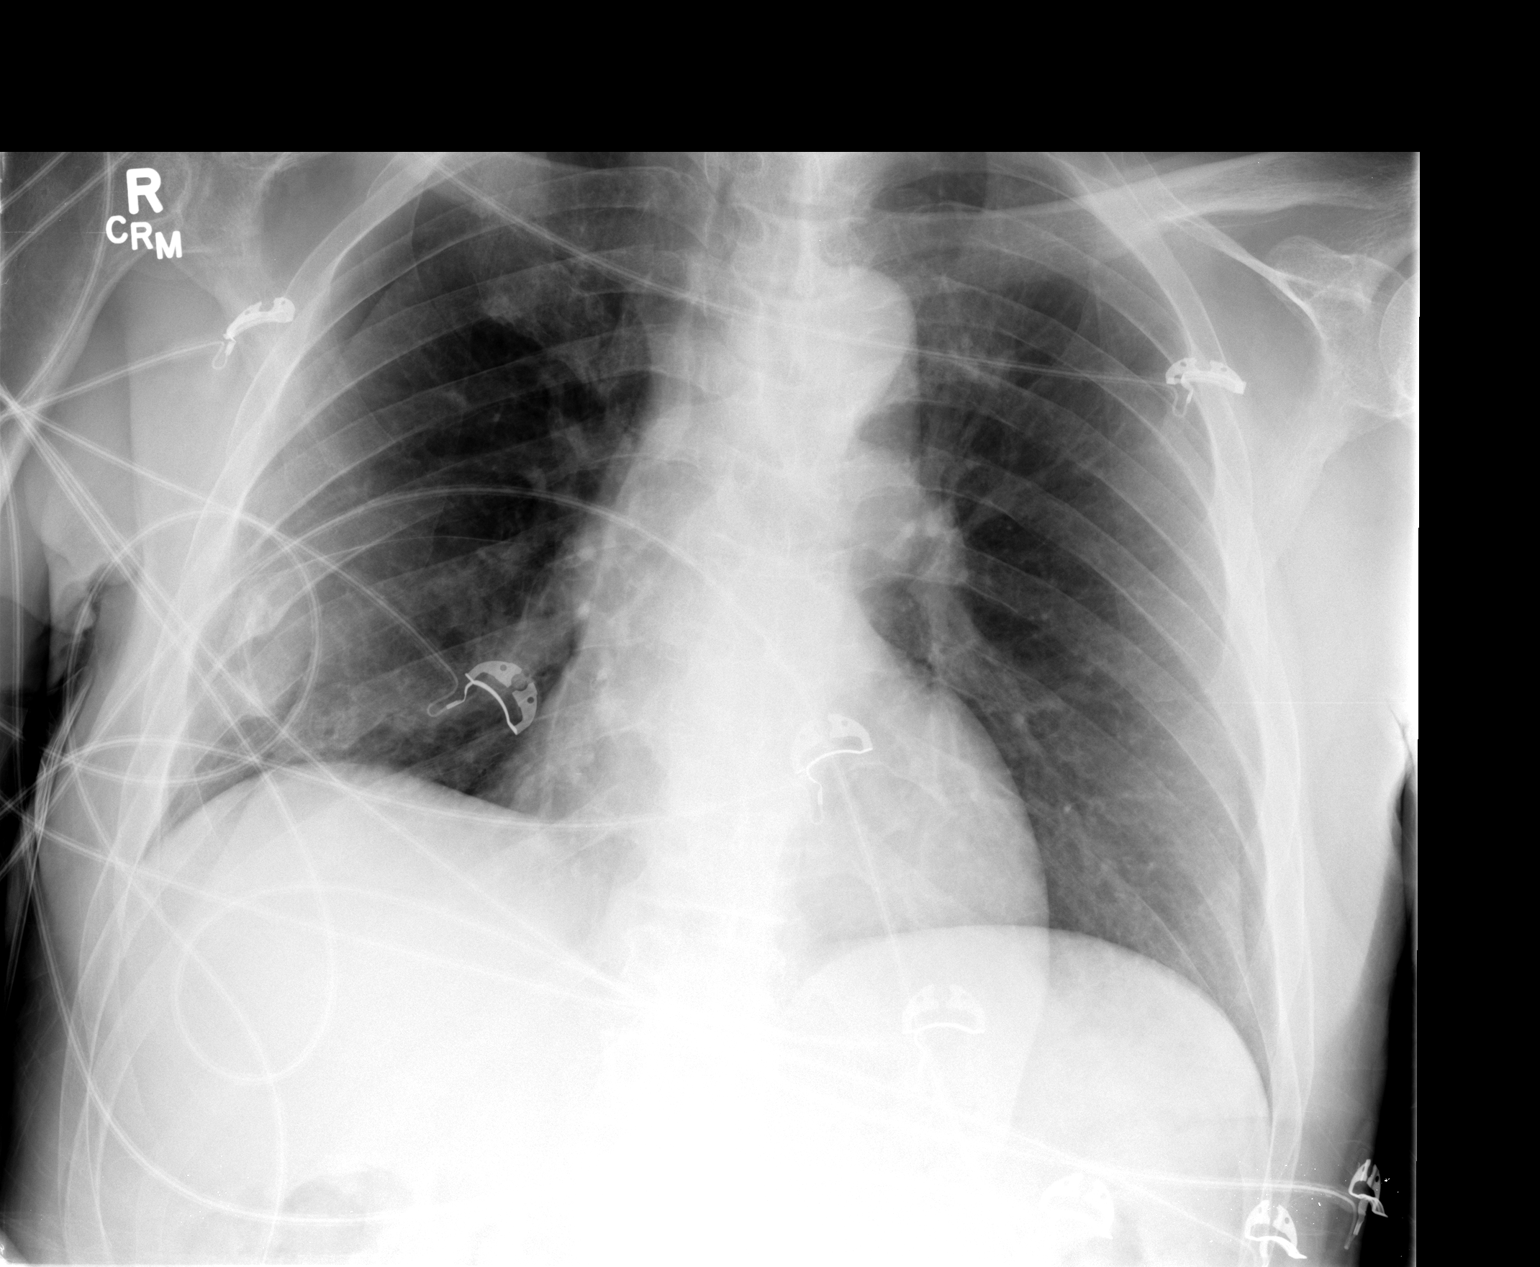

[1 of 1 positions shown; findings below may reference images not displayed]

FINDINGS: The lungs are well-aerated and clear.  There is no
evidence of focal opacification, pleural effusion or pneumothorax.

The cardiomediastinal silhouette is within normal limits.  No acute
osseous abnormalities are seen.  There are healed fractures of the
right posterolateral 7th to 9th ribs.
IMPRESSION: No acute cardiopulmonary process seen.

## 2011-03-26 DIAGNOSIS — E11359 Type 2 diabetes mellitus with proliferative diabetic retinopathy without macular edema: Secondary | ICD-10-CM | POA: Diagnosis not present

## 2011-03-26 DIAGNOSIS — H2520 Age-related cataract, morgagnian type, unspecified eye: Secondary | ICD-10-CM | POA: Diagnosis not present

## 2011-04-04 DIAGNOSIS — I10 Essential (primary) hypertension: Secondary | ICD-10-CM | POA: Diagnosis not present

## 2011-04-04 DIAGNOSIS — E1149 Type 2 diabetes mellitus with other diabetic neurological complication: Secondary | ICD-10-CM | POA: Diagnosis not present

## 2011-04-04 DIAGNOSIS — N189 Chronic kidney disease, unspecified: Secondary | ICD-10-CM | POA: Diagnosis not present

## 2011-04-04 DIAGNOSIS — E785 Hyperlipidemia, unspecified: Secondary | ICD-10-CM | POA: Diagnosis not present

## 2011-04-04 DIAGNOSIS — N400 Enlarged prostate without lower urinary tract symptoms: Secondary | ICD-10-CM | POA: Diagnosis not present

## 2011-04-23 DIAGNOSIS — N189 Chronic kidney disease, unspecified: Secondary | ICD-10-CM | POA: Diagnosis not present

## 2011-04-23 DIAGNOSIS — I10 Essential (primary) hypertension: Secondary | ICD-10-CM | POA: Diagnosis not present

## 2011-04-23 DIAGNOSIS — N400 Enlarged prostate without lower urinary tract symptoms: Secondary | ICD-10-CM | POA: Diagnosis not present

## 2011-04-23 DIAGNOSIS — E785 Hyperlipidemia, unspecified: Secondary | ICD-10-CM | POA: Diagnosis not present

## 2011-04-23 DIAGNOSIS — E1149 Type 2 diabetes mellitus with other diabetic neurological complication: Secondary | ICD-10-CM | POA: Diagnosis not present

## 2011-05-17 DIAGNOSIS — E119 Type 2 diabetes mellitus without complications: Secondary | ICD-10-CM | POA: Diagnosis not present

## 2011-05-17 DIAGNOSIS — D649 Anemia, unspecified: Secondary | ICD-10-CM | POA: Diagnosis not present

## 2011-05-17 DIAGNOSIS — E7800 Pure hypercholesterolemia, unspecified: Secondary | ICD-10-CM | POA: Diagnosis not present

## 2011-05-17 DIAGNOSIS — Z79899 Other long term (current) drug therapy: Secondary | ICD-10-CM | POA: Diagnosis not present

## 2011-05-21 DIAGNOSIS — N039 Chronic nephritic syndrome with unspecified morphologic changes: Secondary | ICD-10-CM | POA: Diagnosis not present

## 2011-05-21 DIAGNOSIS — E1149 Type 2 diabetes mellitus with other diabetic neurological complication: Secondary | ICD-10-CM | POA: Diagnosis not present

## 2011-05-21 DIAGNOSIS — E785 Hyperlipidemia, unspecified: Secondary | ICD-10-CM | POA: Diagnosis not present

## 2011-05-24 DIAGNOSIS — Z79899 Other long term (current) drug therapy: Secondary | ICD-10-CM | POA: Diagnosis not present

## 2011-05-24 DIAGNOSIS — E7800 Pure hypercholesterolemia, unspecified: Secondary | ICD-10-CM | POA: Diagnosis not present

## 2011-05-25 ENCOUNTER — Ambulatory Visit (INDEPENDENT_AMBULATORY_CARE_PROVIDER_SITE_OTHER): Payer: Medicare Other | Admitting: *Deleted

## 2011-05-25 ENCOUNTER — Encounter (INDEPENDENT_AMBULATORY_CARE_PROVIDER_SITE_OTHER): Payer: Medicare Other | Admitting: *Deleted

## 2011-05-25 DIAGNOSIS — I739 Peripheral vascular disease, unspecified: Secondary | ICD-10-CM

## 2011-05-25 DIAGNOSIS — Z48812 Encounter for surgical aftercare following surgery on the circulatory system: Secondary | ICD-10-CM

## 2011-05-28 DIAGNOSIS — N179 Acute kidney failure, unspecified: Secondary | ICD-10-CM | POA: Diagnosis not present

## 2011-05-28 DIAGNOSIS — I150 Renovascular hypertension: Secondary | ICD-10-CM | POA: Diagnosis not present

## 2011-05-29 DIAGNOSIS — Z79899 Other long term (current) drug therapy: Secondary | ICD-10-CM | POA: Diagnosis not present

## 2011-06-01 DIAGNOSIS — Z79899 Other long term (current) drug therapy: Secondary | ICD-10-CM | POA: Diagnosis not present

## 2011-06-04 DIAGNOSIS — N189 Chronic kidney disease, unspecified: Secondary | ICD-10-CM | POA: Diagnosis not present

## 2011-06-04 DIAGNOSIS — N400 Enlarged prostate without lower urinary tract symptoms: Secondary | ICD-10-CM | POA: Diagnosis not present

## 2011-06-04 DIAGNOSIS — N039 Chronic nephritic syndrome with unspecified morphologic changes: Secondary | ICD-10-CM | POA: Diagnosis not present

## 2011-06-04 DIAGNOSIS — I150 Renovascular hypertension: Secondary | ICD-10-CM | POA: Diagnosis not present

## 2011-06-04 DIAGNOSIS — E1149 Type 2 diabetes mellitus with other diabetic neurological complication: Secondary | ICD-10-CM | POA: Diagnosis not present

## 2011-06-05 ENCOUNTER — Other Ambulatory Visit: Payer: Self-pay | Admitting: *Deleted

## 2011-06-05 DIAGNOSIS — I739 Peripheral vascular disease, unspecified: Secondary | ICD-10-CM

## 2011-06-05 DIAGNOSIS — Z48812 Encounter for surgical aftercare following surgery on the circulatory system: Secondary | ICD-10-CM

## 2011-06-06 ENCOUNTER — Encounter: Payer: Self-pay | Admitting: Vascular Surgery

## 2011-06-06 NOTE — Procedures (Unsigned)
BYPASS GRAFT EVALUATION  INDICATION:  Followup right bypass graft.  HISTORY: Diabetes:  Yes Cardiac:  No Hypertension:  Yes Smoking:  No Previous Surgery:  Right popliteal to distal ATA graft 03/11/2006  SINGLE LEVEL ARTERIAL EXAM                              RIGHT              LEFT Brachial: Anterior tibial: Posterior tibial: Peroneal: Ankle/brachial index:  PREVIOUS ABI:  Date:  03/21/2010  RIGHT:  1.09  LEFT:  BKA  LOWER EXTREMITY BYPASS GRAFT DUPLEX EXAM:  DUPLEX:  Patent right popliteal to distal ATA graft with mild native inflow disease of the popliteal artery.  There are elevated velocities in the proximal segment with a ratio of 3.6. Waveforms are triphasic throughout.  IMPRESSION:  Patent right popliteal to distal ATA graft with a 50%-75% stenosis at the proximal segment.  ___________________________________________ Louis English. Hart Rochester, M.D.  LT/MEDQ  D:  05/25/2011  T:  05/25/2011  Job:  [id number]

## 2011-06-08 ENCOUNTER — Emergency Department (HOSPITAL_COMMUNITY)
Admission: EM | Admit: 2011-06-08 | Discharge: 2011-06-09 | Disposition: A | Discharge status: Home / Self Care | Payer: Medicare Other | Attending: Emergency Medicine | Admitting: Emergency Medicine

## 2011-06-08 ENCOUNTER — Encounter (HOSPITAL_COMMUNITY): Payer: Self-pay | Admitting: Emergency Medicine

## 2011-06-08 DIAGNOSIS — Z794 Long term (current) use of insulin: Secondary | ICD-10-CM | POA: Insufficient documentation

## 2011-06-08 DIAGNOSIS — E119 Type 2 diabetes mellitus without complications: Secondary | ICD-10-CM | POA: Insufficient documentation

## 2011-06-08 DIAGNOSIS — K920 Hematemesis: Secondary | ICD-10-CM | POA: Insufficient documentation

## 2011-06-08 DIAGNOSIS — K922 Gastrointestinal hemorrhage, unspecified: Secondary | ICD-10-CM | POA: Diagnosis not present

## 2011-06-08 HISTORY — DX: Disorder of kidney and ureter, unspecified: N28.9

## 2011-06-08 HISTORY — DX: Anemia, unspecified: D64.9

## 2011-06-08 HISTORY — DX: Hyperlipidemia, unspecified: E78.5

## 2011-06-08 HISTORY — DX: Unqualified visual loss, both eyes: H54.3

## 2011-06-08 HISTORY — DX: Gastrointestinal hemorrhage, unspecified: K92.2

## 2011-06-08 HISTORY — DX: Essential (primary) hypertension: I10

## 2011-06-08 LAB — OCCULT BLOOD, POC DEVICE: Fecal Occult Bld: NEGATIVE

## 2011-06-08 MED ORDER — SODIUM CHLORIDE 0.9 % IV SOLN
Freq: Once | INTRAVENOUS | Status: AC
  Administered 2011-06-09: 125 mL/h via INTRAVENOUS

## 2011-06-08 MED ORDER — PANTOPRAZOLE SODIUM 40 MG IV SOLR
40.0000 mg | Freq: Once | INTRAVENOUS | Status: AC
  Administered 2011-06-09: 40 mg via INTRAVENOUS
  Filled 2011-06-08: qty 40

## 2011-06-08 NOTE — ED Provider Notes (Signed)
History     CSN: [id number]  Arrival date & time 06/08/11  2104   First MD Initiated Contact with Patient 06/08/11 2329      Chief Complaint  Patient presents with  . Hematemesis  . Nausea  . Emesis    (Consider location/radiation/quality/duration/timing/severity/associated sxs/prior treatment) Patient is a 56 y.o. male presenting with vomiting. The history is provided by the patient and the nursing home.  Emesis   He was reported to have vomited coffee-ground material at the nursing home where he resides. The report from the nursing home states that he tested positive for blood. He has no complaints whatsoever. He denies nausea and denies abdominal pain. He is bedridden but has not noted any weakness or dizziness.  Past Medical History  Diagnosis Date  . Blind in both eyes   . Diabetes mellitus   . Hypertension   . Renal disorder   . Anemia   . GI (gastrointestinal bleed)   . Hyperlipidemia     History reviewed. No pertinent past surgical history.  No family history on file.  History  Substance Use Topics  . Smoking status: Former Smoker -- 15 years  . Smokeless tobacco: Not on file  . Alcohol Use: No      Review of Systems  Gastrointestinal: Positive for vomiting.  All other systems reviewed and are negative.    Allergies  Review of patient's allergies indicates no known allergies.  Home Medications   Current Outpatient Rx  Name Route Sig Dispense Refill  . ASPIRIN 81 MG PO CHEW Oral Chew 81 mg by mouth daily.    Marland Kitchen CLONIDINE HCL 0.1 MG PO TABS Oral Take 0.1 mg by mouth 3 (three) times daily as needed. For blood pressure    . DOCUSATE SODIUM 100 MG PO CAPS Oral Take 100 mg by mouth as needed. For constipation    . ESCITALOPRAM OXALATE 10 MG PO TABS Oral Take 10 mg by mouth See admin instructions. Take 10mg  with 20mg  to equal total amount of 30mg .    . ESCITALOPRAM OXALATE 20 MG PO TABS Oral Take 20 mg by mouth See admin instructions. Take 20mg  with 10mg   to equal total amount of 30mg .    . PRO-STAT 64 PO LIQD Oral Take 30 mLs by mouth 2 (two) times daily.    Marland Kitchen FLUDROCORTISONE ACETATE 0.1 MG PO TABS Oral Take 0.1 mg by mouth daily.    Marland Kitchen HYDROCHLOROTHIAZIDE 25 MG PO TABS Oral Take 25 mg by mouth daily.    . INSULIN ASPART 100 UNIT/ML [place] SOLN Subcutaneous Inject 4 Units into the skin daily with supper.    . INSULIN ASPART 100 UNIT/ML [place] SOLN Subcutaneous Inject 2-10 Units into the skin daily. In the morning ----Sliding scale: 201-250=2 units, 251-300=4 units, 301-350=6 units, 351-400=8 units, 401-450=10 units, >350 after 2 hours or <60 = call MD    . INSULIN GLARGINE 100 UNIT/ML [place] SOLN Subcutaneous Inject 23 Units into the skin at bedtime.    Marland Kitchen LISINOPRIL 10 MG PO TABS Oral Take 10 mg by mouth daily.    Marland Kitchen MAGNESIUM OXIDE 400 MG PO TABS Oral Take 400 mg by mouth 2 (two) times daily.    Marland Kitchen METOCLOPRAMIDE HCL 10 MG PO TABS Oral Take 10 mg by mouth 4 (four) times daily -  with meals and at bedtime.    Marland Kitchen METOPROLOL TARTRATE 25 MG PO TABS Oral Take 25 mg by mouth 2 (two) times daily.    . CERTAVITE/ANTIOXIDANTS PO TABS  Oral Take 1 tablet by mouth daily.    Marland Kitchen BOOST GLUCOSE CONTROL PO Oral Take 1 each by mouth 3 (three) times daily.    . OXYCODONE HCL 5 MG PO TABS Oral Take 5 mg by mouth every 6 (six) hours as needed. For pain    . POTASSIUM CHLORIDE CRYS ER 20 MEQ PO TBCR Oral Take 20 mEq by mouth daily.    Marland Kitchen SIMVASTATIN 40 MG PO TABS Oral Take 40 mg by mouth at bedtime.    . TAMSULOSIN HCL 0.4 MG PO CAPS Oral Take 0.4 mg by mouth daily.    Marland Kitchen VITAMIN C 500 MG PO TABS Oral Take 500 mg by mouth 2 (two) times daily.      BP 120/77  Pulse 67  Temp(Src) 97.8 F (36.6 C) (Oral)  Resp 17  SpO2 96%  Physical Exam  Nursing note and vitals reviewed.  56 year old male who is resting comfortably and in no acute distress. Vital signs are normal. Oxygen saturation is 96% which is normal. Head is normocephalic and atraumatic. He is blind and is not able to  cooperate to evaluate pupils or pupillary reaction or extraocular movements. Oropharynx is clear. Neck is nontender and supple. Back is nontender. Lungs are clear without any rales or, wheezes, rhonchi. Heart is regular rate and rhythm without murmur. Abdomen is soft, flat, nontender without masses or hepatosplenomegaly. Rectal shows decreased sphincter tone and stool is light tan and has been sent for Hemoccult testing. Extremities have no cyanosis or edema. Skin is warm and dry without rash. Neurologic: He is awake and oriented x3. Other than the nerves associated with vision in the eye movements and pupillary reaction his cranial nerves are intact. There no focal motor or sensory deficits.  ED Course  Procedures (including critical care time)  Results for orders placed during the hospital encounter of 06/08/11  CBC      Component Value Range   WBC 6.4  4.0 - 10.5 (K/uL)   RBC 3.92 (*) 4.22 - 5.81 (MIL/uL)   Hemoglobin 10.7 (*) 13.0 - 17.0 (g/dL)   HCT 46.9 (*) 62.9 - 52.0 (%)   MCV 83.4  78.0 - 100.0 (fL)   MCH 27.3  26.0 - 34.0 (pg)   MCHC 32.7  30.0 - 36.0 (g/dL)   RDW 52.8  41.3 - 24.4 (%)   Platelets 180  150 - 400 (K/uL)  DIFFERENTIAL      Component Value Range   Neutrophils Relative 57  43 - 77 (%)   Neutro Abs 3.7  1.7 - 7.7 (K/uL)   Lymphocytes Relative 30  12 - 46 (%)   Lymphs Abs 1.9  0.7 - 4.0 (K/uL)   Monocytes Relative 11  3 - 12 (%)   Monocytes Absolute 0.7  0.1 - 1.0 (K/uL)   Eosinophils Relative 2  0 - 5 (%)   Eosinophils Absolute 0.1  0.0 - 0.7 (K/uL)   Basophils Relative 0  0 - 1 (%)   Basophils Absolute 0.0  0.0 - 0.1 (K/uL)  BASIC METABOLIC PANEL      Component Value Range   Sodium 138  135 - 145 (mEq/L)   Potassium 3.7  3.5 - 5.1 (mEq/L)   Chloride 100  96 - 112 (mEq/L)   CO2 32  19 - 32 (mEq/L)   Glucose, Bld 114 (*) 70 - 99 (mg/dL)   BUN 26 (*) 6 - 23 (mg/dL)   Creatinine, Ser 0.10 (*) 0.50 - 1.35 (mg/dL)  Calcium 9.3  8.4 - 10.5 (mg/dL)   GFR calc  non Af Amer 52 (*) >90 (mL/min)   GFR calc Af Amer 60 (*) >90 (mL/min)  TYPE AND SCREEN      Component Value Range   ABO/RH(D) O NEG     Antibody Screen NEG     Sample Expiration 06/12/2011    OCCULT BLOOD, POC DEVICE      Component Value Range   Fecal Occult Bld NEGATIVE    HEMOGLOBIN AND HEMATOCRIT, BLOOD      Component Value Range   Hemoglobin 10.2 (*) 13.0 - 17.0 (g/dL)   HCT 16.1 (*) 09.6 - 52.0 (%)   Hemoglobin is unchanged from baseline. He has had no further vomiting in the emergency department. Vital signs are stable without any hypotension or tachycardia. Repeat hemoglobin shows minimal change. At this point, I do not see an indication for hospital admission. He will be discharged on Protonix and is to followup with his PCP in 2 days. Consideration can be given at that point to an endoscopy on an elective basis.  1. Hematemesis       MDM  Apparent hematemesis. Laboratory workup has been initiated and he will be given IV Protonix pending results.        Dione Booze, MD 06/09/11 [id number]

## 2011-06-08 NOTE — ED Notes (Signed)
WUJ:[id number]<JY> Expected date:<BR> Expected time: 9:01 PM<BR> Means of arrival:Ambulance<BR> Comments:<BR> M251 -- GI Bleed

## 2011-06-08 NOTE — ED Notes (Signed)
Per EMS, pt. Is from a Skilled Nursing facility who complaint of nausea with coffee ground vomitus which started @ 6pm this evening. Pt. Is alert and oriented, denies of chest pain , no abdominal pain nor SOB.

## 2011-06-09 DIAGNOSIS — R1110 Vomiting, unspecified: Secondary | ICD-10-CM | POA: Diagnosis not present

## 2011-06-09 DIAGNOSIS — R58 Hemorrhage, not elsewhere classified: Secondary | ICD-10-CM | POA: Diagnosis not present

## 2011-06-09 LAB — CBC
MCV: 83.4 fL (ref 78.0–100.0)
Platelets: 180 10*3/uL (ref 150–400)
RBC: 3.92 MIL/uL — ABNORMAL LOW (ref 4.22–5.81)
RDW: 13.8 % (ref 11.5–15.5)
WBC: 6.4 10*3/uL (ref 4.0–10.5)

## 2011-06-09 LAB — TYPE AND SCREEN
ABO/RH(D): O NEG
Antibody Screen: NEGATIVE

## 2011-06-09 LAB — DIFFERENTIAL
Basophils Absolute: 0 10*3/uL (ref 0.0–0.1)
Eosinophils Relative: 2 % (ref 0–5)
Lymphocytes Relative: 30 % (ref 12–46)
Lymphs Abs: 1.9 10*3/uL (ref 0.7–4.0)
Neutrophils Relative %: 57 % (ref 43–77)

## 2011-06-09 LAB — HEMOGLOBIN AND HEMATOCRIT, BLOOD
HCT: 31.3 % — ABNORMAL LOW (ref 39.0–52.0)
Hemoglobin: 10.2 g/dL — ABNORMAL LOW (ref 13.0–17.0)

## 2011-06-09 LAB — BASIC METABOLIC PANEL
CO2: 32 mEq/L (ref 19–32)
Calcium: 9.3 mg/dL (ref 8.4–10.5)
GFR calc non Af Amer: 52 mL/min — ABNORMAL LOW (ref >90)
Glucose, Bld: 114 mg/dL — ABNORMAL HIGH (ref 70–99)
Potassium: 3.7 mEq/L (ref 3.5–5.1)
Sodium: 138 mEq/L (ref 135–145)

## 2011-06-09 MED ORDER — PANTOPRAZOLE SODIUM 20 MG PO TBEC: 40.0000 mg | DELAYED_RELEASE_TABLET | Freq: Every day | ORAL | Status: DC

## 2011-06-09 MED ORDER — ONDANSETRON HCL 4 MG PO TABS: 4.0000 mg | ORAL_TABLET | Freq: Four times a day (QID) | ORAL | Status: AC

## 2011-06-09 MED ORDER — PANTOPRAZOLE SODIUM 40 MG PO TBEC: 40.0000 mg | DELAYED_RELEASE_TABLET | Freq: Every day | ORAL | Status: AC

## 2011-06-09 NOTE — ED Notes (Signed)
Pt. Discharged  Back to Skilled Nursing facility per [facility], in stable condition.

## 2011-06-09 NOTE — Discharge Instructions (Signed)
Hematemesis This condition is the vomiting of blood. CAUSES  This can happen if you have a peptic ulcer or an irritation of the throat, stomach, or small bowel. Vomiting over and over again or swallowing blood from a nosebleed, coughing or facial injury can also result in bloody vomit. Anti-inflammatory pain medicines are a common cause of this potentially dangerous condition. The most serious causes of vomiting blood include:  Ulcers (a bacteria called H. pylori is common cause of ulcers).   Clotting problems.   Alcoholism.   Cirrhosis.  TREATMENT  Treatment depends on the cause and the severity of the bleeding. Small amounts of blood streaks in the vomit is not the same as vomiting large amounts of bloody or dark, coffee grounds-like material. Weakness, fainting, dehydration, anemia, and continued alcohol or drug use increase the risk. Examination may include blood, vomit, or stool tests. The presence of bloody or dark stool that tests positive for blood (Hemoccult) means the bleeding has been going on for some time. Endoscopy and imaging studies may be done. Emergency treatment may include:  IV medicines or fluids.   Blood transfusions.   Surgery.  Hospital care is required for high risk patients or when IV fluids or blood is needed. Upper GI bleeding can cause shock and death if not controlled. HOME CARE INSTRUCTIONS   Your treatment does not require hospital care at this time.   Remain at rest until your condition improves.   Drink clear liquids as tolerated.   Avoid:   Alcohol.   Nicotine.   Aspirin.   Any other anti-inflammatory medicine (ibuprofen, naproxen, and many others).   Medications to suppress stomach acid or vomiting may be needed. Take all your medicine as prescribed.   Be sure to see your caregiver for follow-up as recommended.  SEEK IMMEDIATE MEDICAL CARE IF:   You have repeated vomiting, dehydration, fainting, or extreme weakness.   You are vomiting  large amounts of bloody or dark material.   You pass large, dark or bloody stools.  Document Released: 04/05/2004 Document Revised: 02/15/2011 Document Reviewed: 04/21/2008 [facility] Patient Information 2012 [place], [place]..  Ondansetron tablets What is this medicine? ONDANSETRON (on DAN se tron) is used to treat nausea and vomiting caused by chemotherapy. It is also used to prevent or treat nausea and vomiting after surgery. This medicine may be used for other purposes; ask your health care provider or pharmacist if you have questions. What should I tell my health care provider before I take this medicine? They need to know if you have any of these conditions: -heart disease -history of irregular heartbeat -liver disease -low levels of magnesium or potassium in the blood -an unusual or allergic reaction to ondansetron, granisetron, other medicines, foods, dyes, or preservatives -pregnant or trying to get pregnant -breast-feeding How should I use this medicine? Take this medicine by mouth with a glass of water. Follow the directions on your prescription label. Take your doses at regular intervals. Do not take your medicine more often than directed. Talk to your pediatrician regarding the use of this medicine in children. Special care may be needed. Overdosage: If you think you have taken too much of this medicine contact a poison control center or emergency room at once. NOTE: This medicine is only for you. Do not share this medicine with others. What if I miss a dose? If you miss a dose, take it as soon as you can. If it is almost time for your next dose, take only that  dose. Do not take double or extra doses. What may interact with this medicine? Do not take this medicine with any of the following medications: -apomorphine  This medicine may also interact with the following medications: -carbamazepine -phenytoin -rifampicin -tramadol This list may not describe all possible  interactions. Give your health care provider a list of all the medicines, herbs, non-prescription drugs, or dietary supplements you use. Also tell them if you smoke, drink alcohol, or use illegal drugs. Some items may interact with your medicine. What should I watch for while using this medicine? Check with your doctor or health care professional right away if you have any sign of an allergic reaction. What side effects may I notice from receiving this medicine? Side effects that you should report to your doctor or health care professional as soon as possible: -allergic reactions like skin rash, itching or hives, swelling of the face, lips or tongue -breathing problems -dizziness -fast or irregular heartbeat -feeling faint or lightheaded, falls -fever and chills -swelling of the hands or feet -tightness in the chest Side effects that usually do not require medical attention (report to your doctor or health care professional if they continue or are bothersome): -constipation or diarrhea -headache This list may not describe all possible side effects. Call your doctor for medical advice about side effects. You may report side effects to FDA at 1-800-FDA-1088. Where should I keep my medicine? Keep out of the reach of children. Store between 2 and 30 degrees C (36 and 86 degrees F). Throw away any unused medicine after the expiration date. NOTE: This sheet is a summary. It may not cover all possible information. If you have questions about this medicine, talk to your doctor, pharmacist, or health care provider.  2012, [employer]vier/Gold Standard. (12/02/2009 11:18:31 AM)  Pantoprazole tablets What is this medicine? PANTOPRAZOLE (pan TOE pra zole) prevents the production of acid in the stomach. It is used to treat gastroesophageal reflux disease (GERD), inflammation of the esophagus, and Zollinger-Ellison syndrome. This medicine may be used for other purposes; ask your health care provider or  pharmacist if you have questions. What should I tell my health care provider before I take this medicine? They need to know if you have any of these conditions: -liver disease -low levels of magnesium in the blood -an unusual or allergic reaction to omeprazole, lansoprazole, pantoprazole, rabeprazole, other medicines, foods, dyes, or preservatives -pregnant or trying to get pregnant -breast-feeding How should I use this medicine? Take this medicine by mouth. Swallow the tablets whole with a drink of water. Follow the directions on the prescription label. Do not crush, break, or chew. Take your medicine at regular intervals. Do not take your medicine more often than directed. Talk to your pediatrician regarding the use of this medicine in children. While this drug may be prescribed for children as young as 5 years for selected conditions, precautions do apply. Overdosage: If you think you have taken too much of this medicine contact a poison control center or emergency room at once. NOTE: This medicine is only for you. Do not share this medicine with others. What if I miss a dose? If you miss a dose, take it as soon as you can. If it is almost time for your next dose, take only that dose. Do not take double or extra doses. What may interact with this medicine? Do not take this medicine with any of the following medications: -atazanavir -nelfinavir This medicine may also interact with the following medications: -ampicillin -  delavirdine -digoxin -diuretics -iron salts -medicines for fungal infections like ketoconazole, itraconazole and voriconazole -warfarin This list may not describe all possible interactions. Give your health care provider a list of all the medicines, herbs, non-prescription drugs, or dietary supplements you use. Also tell them if you smoke, drink alcohol, or use illegal drugs. Some items may interact with your medicine. What should I watch for while using this  medicine? It can take several days before your stomach pain gets better. Check with your doctor or health care professional if your condition does not start to get better, or if it gets worse. You may need blood work done while you are taking this medicine. What side effects may I notice from receiving this medicine? Side effects that you should report to your doctor or health care professional as soon as possible: -allergic reactions like skin rash, itching or hives, swelling of the face, lips, or tongue -bone, muscle or joint pain -breathing problems -chest pain or chest tightness -dark yellow or brown urine -dizziness -fast, irregular heartbeat -feeling faint or lightheaded -fever or sore throat -muscle spasm -palpitations -redness, blistering, peeling or loosening of the skin, including inside the mouth -seizures -tremors -unusual bleeding or bruising -unusually weak or tired -yellowing of the eyes or skin Side effects that usually do not require medical attention (Report these to your doctor or health care professional if they continue or are bothersome.): -constipation -diarrhea -dry mouth -headache -nausea This list may not describe all possible side effects. Call your doctor for medical advice about side effects. You may report side effects to FDA at 1-800-FDA-1088. Where should I keep my medicine? Keep out of the reach of children. Store at room temperature between 15 and 30 degrees C (59 and 86 degrees F). Protect from light and moisture. Throw away any unused medicine after the expiration date. NOTE: This sheet is a summary. It may not cover all possible information. If you have questions about this medicine, talk to your doctor, pharmacist, or health care provider.  2012, Elsevier/Gold Standard. (05/17/2009 12:03:53 PM)

## 2011-06-14 DIAGNOSIS — E1159 Type 2 diabetes mellitus with other circulatory complications: Secondary | ICD-10-CM | POA: Diagnosis not present

## 2011-06-25 DIAGNOSIS — N189 Chronic kidney disease, unspecified: Secondary | ICD-10-CM | POA: Diagnosis not present

## 2011-06-25 DIAGNOSIS — I150 Renovascular hypertension: Secondary | ICD-10-CM | POA: Diagnosis not present

## 2011-06-25 DIAGNOSIS — E785 Hyperlipidemia, unspecified: Secondary | ICD-10-CM | POA: Diagnosis not present

## 2011-06-25 DIAGNOSIS — E1149 Type 2 diabetes mellitus with other diabetic neurological complication: Secondary | ICD-10-CM | POA: Diagnosis not present

## 2011-06-25 DIAGNOSIS — N039 Chronic nephritic syndrome with unspecified morphologic changes: Secondary | ICD-10-CM | POA: Diagnosis not present

## 2011-07-11 DIAGNOSIS — F329 Major depressive disorder, single episode, unspecified: Secondary | ICD-10-CM | POA: Diagnosis not present

## 2011-07-23 DIAGNOSIS — N400 Enlarged prostate without lower urinary tract symptoms: Secondary | ICD-10-CM | POA: Diagnosis not present

## 2011-07-23 DIAGNOSIS — I150 Renovascular hypertension: Secondary | ICD-10-CM | POA: Diagnosis not present

## 2011-07-23 DIAGNOSIS — N039 Chronic nephritic syndrome with unspecified morphologic changes: Secondary | ICD-10-CM | POA: Diagnosis not present

## 2011-07-23 DIAGNOSIS — E785 Hyperlipidemia, unspecified: Secondary | ICD-10-CM | POA: Diagnosis not present

## 2011-07-23 DIAGNOSIS — D631 Anemia in chronic kidney disease: Secondary | ICD-10-CM | POA: Diagnosis not present

## 2011-07-23 DIAGNOSIS — N189 Chronic kidney disease, unspecified: Secondary | ICD-10-CM | POA: Diagnosis not present

## 2011-07-23 DIAGNOSIS — E1149 Type 2 diabetes mellitus with other diabetic neurological complication: Secondary | ICD-10-CM | POA: Diagnosis not present

## 2011-07-26 DIAGNOSIS — F329 Major depressive disorder, single episode, unspecified: Secondary | ICD-10-CM | POA: Diagnosis not present

## 2011-08-20 DIAGNOSIS — D631 Anemia in chronic kidney disease: Secondary | ICD-10-CM | POA: Diagnosis not present

## 2011-08-20 DIAGNOSIS — E1149 Type 2 diabetes mellitus with other diabetic neurological complication: Secondary | ICD-10-CM | POA: Diagnosis not present

## 2011-08-20 DIAGNOSIS — N189 Chronic kidney disease, unspecified: Secondary | ICD-10-CM | POA: Diagnosis not present

## 2011-08-20 DIAGNOSIS — E785 Hyperlipidemia, unspecified: Secondary | ICD-10-CM | POA: Diagnosis not present

## 2011-08-20 DIAGNOSIS — Z79899 Other long term (current) drug therapy: Secondary | ICD-10-CM | POA: Diagnosis not present

## 2011-08-20 DIAGNOSIS — I150 Renovascular hypertension: Secondary | ICD-10-CM | POA: Diagnosis not present

## 2011-08-23 DIAGNOSIS — E119 Type 2 diabetes mellitus without complications: Secondary | ICD-10-CM | POA: Diagnosis not present

## 2011-08-23 DIAGNOSIS — Z79899 Other long term (current) drug therapy: Secondary | ICD-10-CM | POA: Diagnosis not present

## 2011-09-11 DIAGNOSIS — F329 Major depressive disorder, single episode, unspecified: Secondary | ICD-10-CM | POA: Diagnosis not present

## 2011-09-17 DIAGNOSIS — I150 Renovascular hypertension: Secondary | ICD-10-CM | POA: Diagnosis not present

## 2011-09-20 DIAGNOSIS — E1159 Type 2 diabetes mellitus with other circulatory complications: Secondary | ICD-10-CM | POA: Diagnosis not present

## 2011-09-24 DIAGNOSIS — N039 Chronic nephritic syndrome with unspecified morphologic changes: Secondary | ICD-10-CM | POA: Diagnosis not present

## 2011-09-24 DIAGNOSIS — N400 Enlarged prostate without lower urinary tract symptoms: Secondary | ICD-10-CM | POA: Diagnosis not present

## 2011-09-24 DIAGNOSIS — E785 Hyperlipidemia, unspecified: Secondary | ICD-10-CM | POA: Diagnosis not present

## 2011-09-24 DIAGNOSIS — E1149 Type 2 diabetes mellitus with other diabetic neurological complication: Secondary | ICD-10-CM | POA: Diagnosis not present

## 2011-09-24 DIAGNOSIS — N189 Chronic kidney disease, unspecified: Secondary | ICD-10-CM | POA: Diagnosis not present

## 2011-09-24 DIAGNOSIS — I150 Renovascular hypertension: Secondary | ICD-10-CM | POA: Diagnosis not present

## 2011-10-22 DIAGNOSIS — D631 Anemia in chronic kidney disease: Secondary | ICD-10-CM | POA: Diagnosis not present

## 2011-10-22 DIAGNOSIS — N189 Chronic kidney disease, unspecified: Secondary | ICD-10-CM | POA: Diagnosis not present

## 2011-10-22 DIAGNOSIS — E785 Hyperlipidemia, unspecified: Secondary | ICD-10-CM | POA: Diagnosis not present

## 2011-10-22 DIAGNOSIS — E1149 Type 2 diabetes mellitus with other diabetic neurological complication: Secondary | ICD-10-CM | POA: Diagnosis not present

## 2011-10-22 DIAGNOSIS — I150 Renovascular hypertension: Secondary | ICD-10-CM | POA: Diagnosis not present

## 2011-10-22 DIAGNOSIS — N039 Chronic nephritic syndrome with unspecified morphologic changes: Secondary | ICD-10-CM | POA: Diagnosis not present

## 2011-10-29 DIAGNOSIS — I150 Renovascular hypertension: Secondary | ICD-10-CM | POA: Diagnosis not present

## 2011-11-13 DIAGNOSIS — F329 Major depressive disorder, single episode, unspecified: Secondary | ICD-10-CM | POA: Diagnosis not present

## 2011-11-15 DIAGNOSIS — E7800 Pure hypercholesterolemia, unspecified: Secondary | ICD-10-CM | POA: Diagnosis not present

## 2011-11-15 DIAGNOSIS — D649 Anemia, unspecified: Secondary | ICD-10-CM | POA: Diagnosis not present

## 2011-11-15 DIAGNOSIS — E119 Type 2 diabetes mellitus without complications: Secondary | ICD-10-CM | POA: Diagnosis not present

## 2011-11-15 DIAGNOSIS — Z79899 Other long term (current) drug therapy: Secondary | ICD-10-CM | POA: Diagnosis not present

## 2011-11-19 DIAGNOSIS — D631 Anemia in chronic kidney disease: Secondary | ICD-10-CM | POA: Diagnosis not present

## 2011-11-19 DIAGNOSIS — N039 Chronic nephritic syndrome with unspecified morphologic changes: Secondary | ICD-10-CM | POA: Diagnosis not present

## 2011-11-19 DIAGNOSIS — E1129 Type 2 diabetes mellitus with other diabetic kidney complication: Secondary | ICD-10-CM | POA: Diagnosis not present

## 2011-11-19 DIAGNOSIS — I150 Renovascular hypertension: Secondary | ICD-10-CM | POA: Diagnosis not present

## 2011-11-19 DIAGNOSIS — N179 Acute kidney failure, unspecified: Secondary | ICD-10-CM | POA: Diagnosis not present

## 2011-11-23 DIAGNOSIS — E119 Type 2 diabetes mellitus without complications: Secondary | ICD-10-CM | POA: Diagnosis not present

## 2011-11-26 DIAGNOSIS — N189 Chronic kidney disease, unspecified: Secondary | ICD-10-CM | POA: Diagnosis not present

## 2011-11-26 DIAGNOSIS — N400 Enlarged prostate without lower urinary tract symptoms: Secondary | ICD-10-CM | POA: Diagnosis not present

## 2011-11-26 DIAGNOSIS — E785 Hyperlipidemia, unspecified: Secondary | ICD-10-CM | POA: Diagnosis not present

## 2011-11-26 DIAGNOSIS — E1149 Type 2 diabetes mellitus with other diabetic neurological complication: Secondary | ICD-10-CM | POA: Diagnosis not present

## 2011-11-26 DIAGNOSIS — I150 Renovascular hypertension: Secondary | ICD-10-CM | POA: Diagnosis not present

## 2011-11-26 DIAGNOSIS — N039 Chronic nephritic syndrome with unspecified morphologic changes: Secondary | ICD-10-CM | POA: Diagnosis not present

## 2011-12-03 DIAGNOSIS — E875 Hyperkalemia: Secondary | ICD-10-CM | POA: Diagnosis not present

## 2011-12-03 DIAGNOSIS — N179 Acute kidney failure, unspecified: Secondary | ICD-10-CM | POA: Diagnosis not present

## 2011-12-04 DIAGNOSIS — Z79899 Other long term (current) drug therapy: Secondary | ICD-10-CM | POA: Diagnosis not present

## 2011-12-11 DIAGNOSIS — Z23 Encounter for immunization: Secondary | ICD-10-CM | POA: Diagnosis not present

## 2011-12-17 DIAGNOSIS — N400 Enlarged prostate without lower urinary tract symptoms: Secondary | ICD-10-CM | POA: Diagnosis not present

## 2011-12-17 DIAGNOSIS — N189 Chronic kidney disease, unspecified: Secondary | ICD-10-CM | POA: Diagnosis not present

## 2011-12-17 DIAGNOSIS — D631 Anemia in chronic kidney disease: Secondary | ICD-10-CM | POA: Diagnosis not present

## 2011-12-17 DIAGNOSIS — I150 Renovascular hypertension: Secondary | ICD-10-CM | POA: Diagnosis not present

## 2011-12-17 DIAGNOSIS — N039 Chronic nephritic syndrome with unspecified morphologic changes: Secondary | ICD-10-CM | POA: Diagnosis not present

## 2011-12-17 DIAGNOSIS — E785 Hyperlipidemia, unspecified: Secondary | ICD-10-CM | POA: Diagnosis not present

## 2011-12-17 DIAGNOSIS — E1149 Type 2 diabetes mellitus with other diabetic neurological complication: Secondary | ICD-10-CM | POA: Diagnosis not present

## 2011-12-27 DIAGNOSIS — E1159 Type 2 diabetes mellitus with other circulatory complications: Secondary | ICD-10-CM | POA: Diagnosis not present

## 2012-01-21 DIAGNOSIS — E1149 Type 2 diabetes mellitus with other diabetic neurological complication: Secondary | ICD-10-CM | POA: Diagnosis not present

## 2012-01-21 DIAGNOSIS — N189 Chronic kidney disease, unspecified: Secondary | ICD-10-CM | POA: Diagnosis not present

## 2012-01-21 DIAGNOSIS — I150 Renovascular hypertension: Secondary | ICD-10-CM | POA: Diagnosis not present

## 2012-01-21 DIAGNOSIS — E875 Hyperkalemia: Secondary | ICD-10-CM | POA: Diagnosis not present

## 2012-01-21 DIAGNOSIS — E785 Hyperlipidemia, unspecified: Secondary | ICD-10-CM | POA: Diagnosis not present

## 2012-01-24 DIAGNOSIS — Z79899 Other long term (current) drug therapy: Secondary | ICD-10-CM | POA: Diagnosis not present

## 2012-01-24 DIAGNOSIS — E7800 Pure hypercholesterolemia, unspecified: Secondary | ICD-10-CM | POA: Diagnosis not present

## 2012-01-28 DIAGNOSIS — N189 Chronic kidney disease, unspecified: Secondary | ICD-10-CM | POA: Diagnosis not present

## 2012-02-11 DIAGNOSIS — N189 Chronic kidney disease, unspecified: Secondary | ICD-10-CM | POA: Diagnosis not present

## 2012-02-11 DIAGNOSIS — N400 Enlarged prostate without lower urinary tract symptoms: Secondary | ICD-10-CM | POA: Diagnosis not present

## 2012-02-11 DIAGNOSIS — I150 Renovascular hypertension: Secondary | ICD-10-CM | POA: Diagnosis not present

## 2012-02-11 DIAGNOSIS — E1149 Type 2 diabetes mellitus with other diabetic neurological complication: Secondary | ICD-10-CM | POA: Diagnosis not present

## 2012-02-11 DIAGNOSIS — E785 Hyperlipidemia, unspecified: Secondary | ICD-10-CM | POA: Diagnosis not present

## 2012-02-12 DIAGNOSIS — F329 Major depressive disorder, single episode, unspecified: Secondary | ICD-10-CM | POA: Diagnosis not present

## 2012-02-21 DIAGNOSIS — E119 Type 2 diabetes mellitus without complications: Secondary | ICD-10-CM | POA: Diagnosis not present

## 2012-02-25 DIAGNOSIS — H25049 Posterior subcapsular polar age-related cataract, unspecified eye: Secondary | ICD-10-CM | POA: Diagnosis not present

## 2012-02-25 DIAGNOSIS — E11359 Type 2 diabetes mellitus with proliferative diabetic retinopathy without macular edema: Secondary | ICD-10-CM | POA: Diagnosis not present

## 2012-02-25 DIAGNOSIS — E1165 Type 2 diabetes mellitus with hyperglycemia: Secondary | ICD-10-CM | POA: Diagnosis not present

## 2012-03-20 DIAGNOSIS — D631 Anemia in chronic kidney disease: Secondary | ICD-10-CM | POA: Diagnosis not present

## 2012-03-20 DIAGNOSIS — E1149 Type 2 diabetes mellitus with other diabetic neurological complication: Secondary | ICD-10-CM | POA: Diagnosis not present

## 2012-03-20 DIAGNOSIS — N189 Chronic kidney disease, unspecified: Secondary | ICD-10-CM | POA: Diagnosis not present

## 2012-03-20 DIAGNOSIS — E785 Hyperlipidemia, unspecified: Secondary | ICD-10-CM | POA: Diagnosis not present

## 2012-03-20 DIAGNOSIS — I150 Renovascular hypertension: Secondary | ICD-10-CM | POA: Diagnosis not present

## 2012-03-20 DIAGNOSIS — E1159 Type 2 diabetes mellitus with other circulatory complications: Secondary | ICD-10-CM | POA: Diagnosis not present

## 2012-03-20 DIAGNOSIS — N039 Chronic nephritic syndrome with unspecified morphologic changes: Secondary | ICD-10-CM | POA: Diagnosis not present

## 2012-03-20 DIAGNOSIS — Z01810 Encounter for preprocedural cardiovascular examination: Secondary | ICD-10-CM | POA: Diagnosis not present

## 2012-04-14 DIAGNOSIS — I150 Renovascular hypertension: Secondary | ICD-10-CM | POA: Diagnosis not present

## 2012-04-14 DIAGNOSIS — E785 Hyperlipidemia, unspecified: Secondary | ICD-10-CM | POA: Diagnosis not present

## 2012-04-14 DIAGNOSIS — N039 Chronic nephritic syndrome with unspecified morphologic changes: Secondary | ICD-10-CM | POA: Diagnosis not present

## 2012-04-14 DIAGNOSIS — E1149 Type 2 diabetes mellitus with other diabetic neurological complication: Secondary | ICD-10-CM | POA: Diagnosis not present

## 2012-04-14 DIAGNOSIS — D631 Anemia in chronic kidney disease: Secondary | ICD-10-CM | POA: Diagnosis not present

## 2012-04-14 DIAGNOSIS — N189 Chronic kidney disease, unspecified: Secondary | ICD-10-CM | POA: Diagnosis not present

## 2012-04-29 DIAGNOSIS — F329 Major depressive disorder, single episode, unspecified: Secondary | ICD-10-CM | POA: Diagnosis not present

## 2012-05-12 DIAGNOSIS — E1149 Type 2 diabetes mellitus with other diabetic neurological complication: Secondary | ICD-10-CM | POA: Diagnosis not present

## 2012-05-12 DIAGNOSIS — I150 Renovascular hypertension: Secondary | ICD-10-CM | POA: Diagnosis not present

## 2012-05-12 DIAGNOSIS — N189 Chronic kidney disease, unspecified: Secondary | ICD-10-CM | POA: Diagnosis not present

## 2012-05-12 DIAGNOSIS — N400 Enlarged prostate without lower urinary tract symptoms: Secondary | ICD-10-CM | POA: Diagnosis not present

## 2012-05-12 DIAGNOSIS — E785 Hyperlipidemia, unspecified: Secondary | ICD-10-CM | POA: Diagnosis not present

## 2012-05-12 DIAGNOSIS — Z79899 Other long term (current) drug therapy: Secondary | ICD-10-CM | POA: Diagnosis not present

## 2012-05-19 ENCOUNTER — Encounter: Payer: Self-pay | Admitting: Neurosurgery

## 2012-05-19 DIAGNOSIS — Z79899 Other long term (current) drug therapy: Secondary | ICD-10-CM | POA: Diagnosis not present

## 2012-05-19 DIAGNOSIS — E119 Type 2 diabetes mellitus without complications: Secondary | ICD-10-CM | POA: Diagnosis not present

## 2012-05-19 DIAGNOSIS — D649 Anemia, unspecified: Secondary | ICD-10-CM | POA: Diagnosis not present

## 2012-05-19 DIAGNOSIS — E7800 Pure hypercholesterolemia, unspecified: Secondary | ICD-10-CM | POA: Diagnosis not present

## 2012-05-20 ENCOUNTER — Encounter (INDEPENDENT_AMBULATORY_CARE_PROVIDER_SITE_OTHER): Payer: Medicare Other | Admitting: *Deleted

## 2012-05-20 ENCOUNTER — Ambulatory Visit: Payer: Medicare Other | Admitting: Neurosurgery

## 2012-05-20 DIAGNOSIS — I739 Peripheral vascular disease, unspecified: Secondary | ICD-10-CM

## 2012-05-20 DIAGNOSIS — Z48812 Encounter for surgical aftercare following surgery on the circulatory system: Secondary | ICD-10-CM

## 2012-05-21 ENCOUNTER — Encounter: Payer: Self-pay | Admitting: Vascular Surgery

## 2012-05-21 ENCOUNTER — Other Ambulatory Visit: Payer: Self-pay | Admitting: *Deleted

## 2012-05-21 DIAGNOSIS — Z48812 Encounter for surgical aftercare following surgery on the circulatory system: Secondary | ICD-10-CM

## 2012-05-21 DIAGNOSIS — I739 Peripheral vascular disease, unspecified: Secondary | ICD-10-CM

## 2012-05-22 DIAGNOSIS — E1165 Type 2 diabetes mellitus with hyperglycemia: Secondary | ICD-10-CM | POA: Diagnosis not present

## 2012-05-22 DIAGNOSIS — I10 Essential (primary) hypertension: Secondary | ICD-10-CM | POA: Diagnosis not present

## 2012-05-22 DIAGNOSIS — E1129 Type 2 diabetes mellitus with other diabetic kidney complication: Secondary | ICD-10-CM | POA: Diagnosis not present

## 2012-06-12 DIAGNOSIS — E1159 Type 2 diabetes mellitus with other circulatory complications: Secondary | ICD-10-CM | POA: Diagnosis not present

## 2012-06-16 ENCOUNTER — Non-Acute Institutional Stay (SKILLED_NURSING_FACILITY): Payer: Medicare Other | Admitting: Internal Medicine

## 2012-06-16 DIAGNOSIS — E1129 Type 2 diabetes mellitus with other diabetic kidney complication: Secondary | ICD-10-CM

## 2012-06-16 DIAGNOSIS — E7800 Pure hypercholesterolemia, unspecified: Secondary | ICD-10-CM

## 2012-06-16 DIAGNOSIS — E1165 Type 2 diabetes mellitus with hyperglycemia: Secondary | ICD-10-CM | POA: Diagnosis not present

## 2012-06-16 DIAGNOSIS — I150 Renovascular hypertension: Secondary | ICD-10-CM

## 2012-06-18 NOTE — Progress Notes (Signed)
PROGRESS NOTE  DATE: 06-16-12  FACILITY: [facility]  LEVEL OF CARE: SNF  Routine Visit  CHIEF COMPLAINT:  Manage diabetes mellitus and hypertension  HISTORY OF PRESENT ILLNESS:  REASSESSMENT OF ONGOING PROBLEM(S): 1.DM:pt's DM remains stable.  Pt denies polyuria, polydipsia, polyphagia, changes in vision or hypoglycemic episodes.  No complications noted from the medication presently being used.  Last hemoglobin A1c is: 9.3 in 3/14. 2.HTN: Pt 's HTN remains stable.  Denies CP, sob, DOE, pedal edema, headaches, dizziness or visual disturbances.  No complications from the medications currently being used.  Last BP : 116/64, 136/78, 106/64.  PAST MEDICAL HISTORY : Reviewed.  No changes.  CURRENT MEDICATIONS: Reviewed per [facility]  REVIEW OF SYSTEMS:  GENERAL: no change in appetite, no fatigue, no weight changes, no fever, chills or weakness RESPIRATORY: no cough, SOB, DOE, wheezing, hemoptysis CARDIAC: no chest pain, edema or palpitations GI: no abdominal pain, diarrhea, constipation, heart burn, nausea or vomiting  PHYSICAL EXAMINATION  VS:  T 98.4       P 68      RR 16      BP 116/64     POX %     WT (Lb) 199  GENERAL: no acute distress, normal body habitus NECK: supple, trachea midline, no neck masses, no thyroid tenderness, no thyromegaly RESPIRATORY: breathing is even & unlabored, BS CTAB CARDIAC: RRR, no murmur,no extra heart sounds, no edema GI: abdomen soft, normal BS, no masses, no tenderness, no hepatomegaly, no splenomegaly PSYCHIATRIC: the patient is alert & oriented to person, affect & behavior appropriate  LABS/RADIOLOGY: 3/14 hemoglobin 11.6,  MCV 87 otherwise CBC normal, glucose 198, BUN 36, creatinine 1.81 otherwise CMP normal, fasting lipid panel normal, magnesium 1.8  ASSESSMENT/PLAN: 1. diabetes mellitus-uncontrolled. Levemir was increased. 2. renovascular hypertension-well-controlled. 3. Hyperlipidemia-well-controlled. 4. hypomagnesemia-well  repleted. 5. BPH-denies symptoms. 6. chronic kidney disease-stable.  CPT CODE: 16109

## 2012-07-14 ENCOUNTER — Non-Acute Institutional Stay (SKILLED_NURSING_FACILITY): Payer: Medicare Other | Admitting: Internal Medicine

## 2012-07-14 DIAGNOSIS — E7800 Pure hypercholesterolemia, unspecified: Secondary | ICD-10-CM

## 2012-07-14 DIAGNOSIS — I150 Renovascular hypertension: Secondary | ICD-10-CM

## 2012-07-14 DIAGNOSIS — E1029 Type 1 diabetes mellitus with other diabetic kidney complication: Secondary | ICD-10-CM | POA: Diagnosis not present

## 2012-07-14 DIAGNOSIS — E1065 Type 1 diabetes mellitus with hyperglycemia: Secondary | ICD-10-CM

## 2012-07-18 DIAGNOSIS — E7800 Pure hypercholesterolemia, unspecified: Secondary | ICD-10-CM | POA: Insufficient documentation

## 2012-07-18 DIAGNOSIS — I150 Renovascular hypertension: Secondary | ICD-10-CM

## 2012-07-18 DIAGNOSIS — E1029 Type 1 diabetes mellitus with other diabetic kidney complication: Secondary | ICD-10-CM | POA: Insufficient documentation

## 2012-07-18 HISTORY — DX: Renovascular hypertension: I15.0

## 2012-07-18 HISTORY — DX: Disorders of magnesium metabolism, unspecified: E83.40

## 2012-07-18 HISTORY — DX: Pure hypercholesterolemia, unspecified: E78.00

## 2012-07-18 NOTE — Progress Notes (Signed)
PROGRESS NOTE  DATE: 07-14-12  FACILITY: [facility]  LEVEL OF CARE: SNF  Routine Visit  CHIEF COMPLAINT:  Manage diabetes mellitus and hypertension  HISTORY OF PRESENT ILLNESS:  REASSESSMENT OF ONGOING PROBLEM(S):  1.DM:pt's DM remains stable.  Pt denies polyuria, polydipsia, polyphagia, changes in vision or hypoglycemic episodes.  No complications noted from the medication presently being used.  Last hemoglobin A1c is: 9.3 in 3/14.  2.HTN: Pt 's HTN remains stable.  Denies CP, sob, DOE, pedal edema, headaches, dizziness or visual disturbances.  No complications from the medications currently being used.  Last BP : 136/76,116/64, 136/78, 106/64.  PAST MEDICAL HISTORY : Reviewed.  No changes.  CURRENT MEDICATIONS: Reviewed per [facility]  REVIEW OF SYSTEMS:  GENERAL: no change in appetite, no fatigue, no weight changes, no fever, chills or weakness RESPIRATORY: no cough, SOB, DOE, wheezing, hemoptysis CARDIAC: no chest pain, edema or palpitations GI: no abdominal pain, diarrhea, constipation, heart burn, nausea or vomiting  PHYSICAL EXAMINATION  VS:  T 97.9      P 76      RR 20      BP 136/76     POX %     WT (Lb) 204  GENERAL: no acute distress, normal body habitus NECK: supple, trachea midline, no neck masses, no thyroid tenderness, no thyromegaly RESPIRATORY: breathing is even & unlabored, BS CTAB CARDIAC: RRR, no murmur,no extra heart sounds, no edema GI: abdomen soft, normal BS, no masses, no tenderness, no hepatomegaly, no splenomegaly PSYCHIATRIC: the patient is alert & oriented to person, affect & behavior appropriate  LABS/RADIOLOGY: 3/14 hemoglobin 11.6,  MCV 87 otherwise CBC normal, glucose 198, BUN 36, creatinine 1.81 otherwise CMP normal, fasting lipid panel normal, magnesium 1.8  ASSESSMENT/PLAN: 1. diabetes mellitus-uncontrolled. Levemir was increased. 2. renovascular hypertension-well-controlled. 3. Hyperlipidemia-well-controlled. 4. hypomagnesemia-well  repleted. 5. BPH-denies symptoms. 6. chronic kidney disease-stable.  CPT CODE: 16109

## 2012-08-07 ENCOUNTER — Non-Acute Institutional Stay (SKILLED_NURSING_FACILITY): Payer: Medicare Other | Admitting: Adult Health

## 2012-08-07 DIAGNOSIS — E876 Hypokalemia: Secondary | ICD-10-CM | POA: Insufficient documentation

## 2012-08-07 DIAGNOSIS — I150 Renovascular hypertension: Secondary | ICD-10-CM

## 2012-08-07 DIAGNOSIS — F329 Major depressive disorder, single episode, unspecified: Secondary | ICD-10-CM | POA: Insufficient documentation

## 2012-08-07 DIAGNOSIS — E1129 Type 2 diabetes mellitus with other diabetic kidney complication: Secondary | ICD-10-CM

## 2012-08-07 DIAGNOSIS — K219 Gastro-esophageal reflux disease without esophagitis: Secondary | ICD-10-CM

## 2012-08-07 DIAGNOSIS — N058 Unspecified nephritic syndrome with other morphologic changes: Secondary | ICD-10-CM | POA: Diagnosis not present

## 2012-08-07 DIAGNOSIS — N400 Enlarged prostate without lower urinary tract symptoms: Secondary | ICD-10-CM | POA: Insufficient documentation

## 2012-08-07 DIAGNOSIS — F32A Depression, unspecified: Secondary | ICD-10-CM | POA: Insufficient documentation

## 2012-08-07 HISTORY — DX: Type 2 diabetes mellitus with other diabetic kidney complication: E11.29

## 2012-08-07 HISTORY — DX: Gastro-esophageal reflux disease without esophagitis: K21.9

## 2012-08-18 ENCOUNTER — Non-Acute Institutional Stay (SKILLED_NURSING_FACILITY): Payer: Medicare Other | Admitting: Internal Medicine

## 2012-08-18 DIAGNOSIS — I150 Renovascular hypertension: Secondary | ICD-10-CM

## 2012-08-18 DIAGNOSIS — E7800 Pure hypercholesterolemia, unspecified: Secondary | ICD-10-CM | POA: Diagnosis not present

## 2012-08-18 DIAGNOSIS — E1029 Type 1 diabetes mellitus with other diabetic kidney complication: Secondary | ICD-10-CM | POA: Diagnosis not present

## 2012-08-18 DIAGNOSIS — E1065 Type 1 diabetes mellitus with hyperglycemia: Secondary | ICD-10-CM

## 2012-08-18 NOTE — Progress Notes (Signed)
PROGRESS NOTE  DATE: 08-18-12  FACILITY: [facility]  LEVEL OF CARE: SNF  Routine Visit  CHIEF COMPLAINT:  Manage diabetes mellitus and hypertension  HISTORY OF PRESENT ILLNESS:  REASSESSMENT OF ONGOING PROBLEM(S):  1.DM:pt's DM remains stable.  Pt denies polyuria, polydipsia, polyphagia, changes in vision or hypoglycemic episodes.  No complications noted from the medication presently being used.  Last hemoglobin A1c is: 9.3 in 3/14.  2.HTN: Pt 's HTN remains stable.  Denies CP, sob, DOE, pedal edema, headaches, dizziness or visual disturbances.  No complications from the medications currently being used.  Last BP : 114/84, 136/76,116/64, 136/78, 106/64.  PAST MEDICAL HISTORY : Reviewed.  No changes.  CURRENT MEDICATIONS: Reviewed per [facility]  REVIEW OF SYSTEMS:  GENERAL: no change in appetite, no fatigue, no weight changes, no fever, chills or weakness RESPIRATORY: no cough, SOB, DOE, wheezing, hemoptysis CARDIAC: no chest pain, edema or palpitations GI: no abdominal pain, diarrhea, constipation, heart burn, nausea or vomiting  PHYSICAL EXAMINATION  VS:  T 97.9      P 68     RR 16      BP 114/84     POX %     WT (Lb) 206  GENERAL: no acute distress, normal body habitus NECK: supple, trachea midline, no neck masses, no thyroid tenderness, no thyromegaly RESPIRATORY: breathing is even & unlabored, BS CTAB CARDIAC: RRR, no murmur,no extra heart sounds, no edema GI: abdomen soft, normal BS, no masses, no tenderness, no hepatomegaly, no splenomegaly PSYCHIATRIC: the patient is alert & oriented to person, affect & behavior appropriate  LABS/RADIOLOGY: 3/14 hemoglobin 11.6,  MCV 87 otherwise CBC normal, glucose 198, BUN 36, creatinine 1.81 otherwise CMP normal, fasting lipid panel normal, magnesium 1.8  ASSESSMENT/PLAN: 1. diabetes mellitus-uncontrolled. Levemir was increased. 2. renovascular hypertension-well-controlled. 3. Hyperlipidemia-well-controlled. 4.  hypomagnesemia-well repleted. 5. BPH-denies symptoms. 6. chronic kidney disease-stable.  CPT CODE: 16109

## 2012-08-21 DIAGNOSIS — E119 Type 2 diabetes mellitus without complications: Secondary | ICD-10-CM | POA: Diagnosis not present

## 2012-08-25 ENCOUNTER — Non-Acute Institutional Stay (SKILLED_NURSING_FACILITY): Payer: Medicare Other | Admitting: Internal Medicine

## 2012-08-25 DIAGNOSIS — E1029 Type 1 diabetes mellitus with other diabetic kidney complication: Secondary | ICD-10-CM

## 2012-08-25 DIAGNOSIS — E1065 Type 1 diabetes mellitus with hyperglycemia: Secondary | ICD-10-CM | POA: Diagnosis not present

## 2012-09-09 DIAGNOSIS — F329 Major depressive disorder, single episode, unspecified: Secondary | ICD-10-CM | POA: Diagnosis not present

## 2012-09-15 NOTE — Progress Notes (Signed)
Patient ID: Louis English, male   DOB: 05-20-1955, 57 y.o.   MRN: [id number]        PROGRESS NOTE  DATE: 08/25/2012  FACILITY:  [facility]  LEVEL OF CARE: SNF (31)  Acute Visit  CHIEF COMPLAINT:  Manage diabetes mellitus.   HISTORY OF PRESENT ILLNESS: I was requested by the staff to assess the patient regarding above problem(s):  DM:pt's DM is unstable.  Pt denies polyuria, polydipsia, polyphagia, changes in vision or hypoglycemic episodes.  No complications noted from the medication presently being used.  Last hemoglobin A1c is:  On 08/21/2012:  Hemoglobin A1C 8.6.  PAST MEDICAL HISTORY : Reviewed.  No changes.  CURRENT MEDICATIONS: Reviewed per [facility]  REVIEW OF SYSTEMS:  GENERAL: no change in appetite, no fatigue, no weight changes, no fever, chills or weakness RESPIRATORY: no cough, SOB, DOE,, wheezing, hemoptysis CARDIAC: no chest pain, edema or palpitations GI: no abdominal pain, diarrhea, constipation, heart burn, nausea or vomiting  PHYSICAL EXAMINATION  GENERAL: no acute distress, normal body habitus NECK: supple, trachea midline, no neck masses, no thyroid tenderness, no thyromegaly RESPIRATORY: breathing is even & unlabored, BS CTAB CARDIAC: RRR, no murmur,no extra heart sounds, no edema GI: abdomen soft, normal BS, no masses, no tenderness, no hepatomegaly, no splenomegaly PSYCHIATRIC: the patient is alert & oriented to person, affect & behavior appropriate  ASSESSMENT/PLAN:  Diabetes mellitus with renal complications.  Uncontrolled.  Increase Levemir to 50 U q.h.s.    CPT CODE: 40981

## 2012-09-16 DIAGNOSIS — E1159 Type 2 diabetes mellitus with other circulatory complications: Secondary | ICD-10-CM | POA: Diagnosis not present

## 2012-09-22 ENCOUNTER — Non-Acute Institutional Stay (SKILLED_NURSING_FACILITY): Payer: Medicare Other | Admitting: Internal Medicine

## 2012-09-22 DIAGNOSIS — E7800 Pure hypercholesterolemia, unspecified: Secondary | ICD-10-CM | POA: Diagnosis not present

## 2012-09-22 DIAGNOSIS — E1065 Type 1 diabetes mellitus with hyperglycemia: Secondary | ICD-10-CM

## 2012-09-22 DIAGNOSIS — E1029 Type 1 diabetes mellitus with other diabetic kidney complication: Secondary | ICD-10-CM

## 2012-09-22 DIAGNOSIS — I150 Renovascular hypertension: Secondary | ICD-10-CM

## 2012-09-22 NOTE — Progress Notes (Signed)
PROGRESS NOTE  DATE: 09-22-12  FACILITY: [facility]  LEVEL OF CARE: SNF  Routine Visit  CHIEF COMPLAINT:  Manage diabetes mellitus and hypertension  HISTORY OF PRESENT ILLNESS:  REASSESSMENT OF ONGOING PROBLEM(S):  1.DM:pt's DM remains stable.  Pt denies polyuria, polydipsia, polyphagia, changes in vision or hypoglycemic episodes.  No complications noted from the medication presently being used.  Last hemoglobin A1c is: 9.3 in 3/14, in 6/14 hemoglobin A1c 8.6.  2.HTN: Pt 's HTN remains stable.  Denies CP, sob, DOE, pedal edema, headaches, dizziness or visual disturbances.  No complications from the medications currently being used.  Last BP : 116/66, 114/84, 136/76,116/64, 136/78, 106/64.  PAST MEDICAL HISTORY : Reviewed.  No changes.  CURRENT MEDICATIONS: Reviewed per [facility]  REVIEW OF SYSTEMS:  GENERAL: no change in appetite, no fatigue, no weight changes, no fever, chills or weakness RESPIRATORY: no cough, SOB, DOE, wheezing, hemoptysis CARDIAC: no chest pain, edema or palpitations GI: no abdominal pain, diarrhea, constipation, heart burn, nausea or vomiting  PHYSICAL EXAMINATION  VS:  T 98.8      P 64     RR 18      BP 116/66     POX %     WT (Lb) 201  GENERAL: no acute distress, normal body habitus NECK: supple, trachea midline, no neck masses, no thyroid tenderness, no thyromegaly RESPIRATORY: breathing is even & unlabored, BS CTAB CARDIAC: RRR, no murmur,no extra heart sounds, no edema GI: abdomen soft, normal BS, no masses, no tenderness, no hepatomegaly, no splenomegaly PSYCHIATRIC: the patient is alert & oriented to person, affect & behavior appropriate  LABS/RADIOLOGY: 3/14 hemoglobin 11.6,  MCV 87 otherwise CBC normal, glucose 198, BUN 36, creatinine 1.81 otherwise CMP normal, fasting lipid panel normal, magnesium 1.8  ASSESSMENT/PLAN: 1. diabetes mellitus-uncontrolled. Levemir was increased. 2. renovascular hypertension-well-controlled. 3.  Hyperlipidemia-well-controlled. 4. hypomagnesemia-well repleted. 5. BPH-denies symptoms. 6. chronic kidney disease-stable.  CPT CODE: 82956

## 2012-10-13 DIAGNOSIS — F329 Major depressive disorder, single episode, unspecified: Secondary | ICD-10-CM | POA: Diagnosis not present

## 2012-10-27 ENCOUNTER — Non-Acute Institutional Stay (SKILLED_NURSING_FACILITY): Payer: Medicare Other | Admitting: Internal Medicine

## 2012-10-27 DIAGNOSIS — I150 Renovascular hypertension: Secondary | ICD-10-CM | POA: Diagnosis not present

## 2012-10-27 DIAGNOSIS — E1029 Type 1 diabetes mellitus with other diabetic kidney complication: Secondary | ICD-10-CM

## 2012-10-27 DIAGNOSIS — E7800 Pure hypercholesterolemia, unspecified: Secondary | ICD-10-CM

## 2012-11-01 NOTE — Progress Notes (Signed)
PROGRESS NOTE  DATE: 10-27-12  FACILITY: [facility]  LEVEL OF CARE: SNF  Routine Visit  CHIEF COMPLAINT:  Manage diabetes mellitus and hypertension  HISTORY OF PRESENT ILLNESS:  REASSESSMENT OF ONGOING PROBLEM(S):  DM:pt's DM remains stable.  Pt denies polyuria, polydipsia, polyphagia, changes in vision or hypoglycemic episodes.  No complications noted from the medication presently being used.  Last hemoglobin A1c is: 9.3 in 3/14, in 6/14 hemoglobin A1c 8.6.  HTN: Pt 's HTN remains stable.  Denies CP, sob, DOE, pedal edema, headaches, dizziness or visual disturbances.  No complications from the medications currently being used.  Last BP : 116/66, 114/84, 136/76,116/64, 136/78, 106/64, 112/66.  PAST MEDICAL HISTORY : Reviewed.  No changes.  CURRENT MEDICATIONS: Reviewed per [facility]  REVIEW OF SYSTEMS:  GENERAL: no change in appetite, no fatigue, no weight changes, no fever, chills or weakness RESPIRATORY: no cough, SOB, DOE, wheezing, hemoptysis CARDIAC: no chest pain, edema or palpitations GI: no abdominal pain, diarrhea, constipation, heart burn, nausea or vomiting  PHYSICAL EXAMINATION  VS:  T 96      P 70     RR 18      BP 112/66     POX %     WT (Lb) 200  GENERAL: no acute distress, normal body habitus NECK: supple, trachea midline, no neck masses, no thyroid tenderness, no thyromegaly RESPIRATORY: breathing is even & unlabored, BS CTAB CARDIAC: RRR, no murmur,no extra heart sounds, no edema GI: abdomen soft, normal BS, no masses, no tenderness, no hepatomegaly, no splenomegaly PSYCHIATRIC: the patient is alert & oriented to person, affect & behavior appropriate  LABS/RADIOLOGY: 3/14 hemoglobin 11.6,  MCV 87 otherwise CBC normal, glucose 198, BUN 36, creatinine 1.81 otherwise CMP normal, fasting lipid panel normal, magnesium 1.8  ASSESSMENT/PLAN: diabetes mellitus-uncontrolled. Levemir was increased. renovascular  hypertension-well-controlled. Hyperlipidemia-well-controlled. hypomagnesemia-well repleted. BPH-denies symptoms. chronic kidney disease-stable.  CPT CODE: 16109

## 2012-11-13 DIAGNOSIS — D649 Anemia, unspecified: Secondary | ICD-10-CM | POA: Diagnosis not present

## 2012-11-13 DIAGNOSIS — Z79899 Other long term (current) drug therapy: Secondary | ICD-10-CM | POA: Diagnosis not present

## 2012-11-13 DIAGNOSIS — E7800 Pure hypercholesterolemia, unspecified: Secondary | ICD-10-CM | POA: Diagnosis not present

## 2012-11-13 DIAGNOSIS — E119 Type 2 diabetes mellitus without complications: Secondary | ICD-10-CM | POA: Diagnosis not present

## 2012-11-17 ENCOUNTER — Non-Acute Institutional Stay (SKILLED_NURSING_FACILITY): Payer: Medicare Other | Admitting: Internal Medicine

## 2012-11-17 DIAGNOSIS — N189 Chronic kidney disease, unspecified: Secondary | ICD-10-CM

## 2012-11-17 DIAGNOSIS — E1029 Type 1 diabetes mellitus with other diabetic kidney complication: Secondary | ICD-10-CM

## 2012-11-17 DIAGNOSIS — D631 Anemia in chronic kidney disease: Secondary | ICD-10-CM

## 2012-11-24 ENCOUNTER — Non-Acute Institutional Stay (SKILLED_NURSING_FACILITY): Payer: Medicare Other | Admitting: Internal Medicine

## 2012-11-24 DIAGNOSIS — I150 Renovascular hypertension: Secondary | ICD-10-CM

## 2012-11-24 DIAGNOSIS — E7800 Pure hypercholesterolemia, unspecified: Secondary | ICD-10-CM | POA: Diagnosis not present

## 2012-11-24 DIAGNOSIS — E1029 Type 1 diabetes mellitus with other diabetic kidney complication: Secondary | ICD-10-CM | POA: Diagnosis not present

## 2012-11-24 NOTE — Progress Notes (Signed)
PROGRESS NOTE  DATE: 11-24-12  FACILITY: [facility]  LEVEL OF CARE: SNF  Routine Visit  CHIEF COMPLAINT:  Manage diabetes mellitus and hypertension  HISTORY OF PRESENT ILLNESS:  REASSESSMENT OF ONGOING PROBLEM(S):  DM:pt's DM remains stable.  Pt denies polyuria, polydipsia, polyphagia, changes in vision or hypoglycemic episodes.  No complications noted from the medication presently being used.  Last hemoglobin A1c is: 9.3 in 3/14, in 6/14 hemoglobin A1c 8.6, in 9-14 hemoglobin A1c 8.1  HTN: Pt 's HTN remains stable.  Denies CP, sob, DOE, pedal edema, headaches, dizziness or visual disturbances.  No complications from the medications currently being used.  Last BP : 116/66, 114/84, 136/76,116/64, 136/78, 106/64, 112/66, 100/52.  PAST MEDICAL HISTORY : Reviewed.  No changes.  CURRENT MEDICATIONS: Reviewed per [facility]  REVIEW OF SYSTEMS:  GENERAL: no change in appetite, no fatigue, no weight changes, no fever, chills or weakness RESPIRATORY: no cough, SOB, DOE, wheezing, hemoptysis CARDIAC: no chest pain, edema or palpitations GI: no abdominal pain, diarrhea, constipation, heart burn, nausea or vomiting  PHYSICAL EXAMINATION  VS:  T 96.7      P 66    RR 18      BP 100/52     POX %     WT (Lb) 207.5  GENERAL: no acute distress, normal body habitus NECK: supple, trachea midline, no neck masses, no thyroid tenderness, no thyromegaly RESPIRATORY: breathing is even & unlabored, BS CTAB CARDIAC: RRR, no murmur,no extra heart sounds, no edema GI: abdomen soft, normal BS, no masses, no tenderness, no hepatomegaly, no splenomegaly PSYCHIATRIC: the patient is alert & oriented to person, affect & behavior appropriate  LABS/RADIOLOGY:  9-14 hemoglobin 10.8, MCV 85 otherwise CBC normal, glucose 140, BUN 43, creatinine 1.77 otherwise CMP normal, HDL 39 otherwise fasting lipid panel normal, magnesium 1.9 3/14 hemoglobin 11.6,  MCV 87 otherwise CBC normal, glucose 198, BUN 36, creatinine  1.81 otherwise CMP normal, fasting lipid panel normal, magnesium 1.8  ASSESSMENT/PLAN: diabetes mellitus-uncontrolled. Levemir was increased. renovascular hypertension-well-controlled. Hyperlipidemia-well-controlled. hypomagnesemia-well repleted. BPH-denies symptoms. chronic kidney disease-stable.  CPT CODE: 21308

## 2012-11-25 DIAGNOSIS — F329 Major depressive disorder, single episode, unspecified: Secondary | ICD-10-CM | POA: Diagnosis not present

## 2012-12-12 DIAGNOSIS — D631 Anemia in chronic kidney disease: Secondary | ICD-10-CM | POA: Insufficient documentation

## 2012-12-12 DIAGNOSIS — N189 Chronic kidney disease, unspecified: Secondary | ICD-10-CM

## 2012-12-12 HISTORY — DX: Chronic kidney disease, unspecified: N18.9

## 2012-12-12 HISTORY — DX: Anemia in chronic kidney disease: D63.1

## 2012-12-12 NOTE — Progress Notes (Signed)
Patient ID: Louis English, male   DOB: November 17, 1955, 57 y.o.   MRN: [id number]        PROGRESS NOTE  DATE: 11/17/2012  FACILITY:  [facility]  LEVEL OF CARE: SNF (31)  Acute Visit  CHIEF COMPLAINT:  Manage diabetes mellitus, chronic kidney disease, and anemia of chronic kidney disease.    HISTORY OF PRESENT ILLNESS: I was requested by the staff to assess the patient regarding above problem(s):  DM:pt's DM is unstable.  Pt denies polyuria, polydipsia, polyphagia, changes in vision or hypoglycemic episodes.  No complications noted from the medication presently being used.  Last hemoglobin A1c is:  On 11/13/2012:  Hemoglobin A1c 8.1.    CHRONIC KIDNEY DISEASE: The patient's chronic kidney disease remains stable.  Patient denies increasing lower extremity swelling or confusion. Last BUN and creatinine are:   On 11/13/2012:  BUN 43, creatinine 1.77.   In 08/2012:  BUN 36, creatinine 1.81.    ANEMIA: The anemia has been stable. The patient denies fatigue, melena or hematochezia. The patient is currently not on iron.   On 11/13/2012:  Hemoglobin 10.8, MCV 85.  In 08/2012:  Hemoglobin 8.6.    PAST MEDICAL HISTORY : Reviewed.  No changes.  CURRENT MEDICATIONS: Reviewed per [facility]  REVIEW OF SYSTEMS:  Difficult to obtain.  Patient is a poor historian.    PHYSICAL EXAMINATION  GENERAL: no acute distress, normal body habitus EYES: conjunctivae normal, sclerae normal, normal eye lids NECK: supple, trachea midline, no neck masses, no thyroid tenderness, no thyromegaly LYMPHATICS: no LAN in the neck, no supraclavicular LAN RESPIRATORY: breathing is even & unlabored, BS CTAB CARDIAC: RRR, no murmur,no extra heart sounds, no edema GI: abdomen soft, normal BS, no masses, no tenderness, no hepatomegaly, no splenomegaly PSYCHIATRIC: the patient is alert & oriented to person, affect & behavior appropriate  ASSESSMENT/PLAN:  Diabetes mellitus with renal complications.  Uncontrolled.   Increase Levemir to 55 U q.h.s.    Chronic kidney disease.  Renal functions improved.    Anemia of chronic kidney disease.  Hemoglobin improved.     CPT CODE: 14782

## 2012-12-15 ENCOUNTER — Non-Acute Institutional Stay (SKILLED_NURSING_FACILITY): Payer: Medicare Other | Admitting: Internal Medicine

## 2012-12-15 DIAGNOSIS — E7800 Pure hypercholesterolemia, unspecified: Secondary | ICD-10-CM | POA: Diagnosis not present

## 2012-12-15 DIAGNOSIS — I150 Renovascular hypertension: Secondary | ICD-10-CM | POA: Diagnosis not present

## 2012-12-15 DIAGNOSIS — E1029 Type 1 diabetes mellitus with other diabetic kidney complication: Secondary | ICD-10-CM

## 2012-12-20 NOTE — Progress Notes (Signed)
PROGRESS NOTE  DATE: 12-15-12  FACILITY: [facility]  LEVEL OF CARE: SNF  Routine Visit  CHIEF COMPLAINT:  Manage diabetes mellitus and hypertension  HISTORY OF PRESENT ILLNESS:  REASSESSMENT OF ONGOING PROBLEM(S):  DM:pt's DM remains stable.  Pt denies polyuria, polydipsia, polyphagia, changes in vision or hypoglycemic episodes.  No complications noted from the medication presently being used.  Last hemoglobin A1c is: 9.3 in 3/14, in 6/14 hemoglobin A1c 8.6, in 9-14 hemoglobin A1c 8.1  HTN: Pt 's HTN remains stable.  Denies CP, sob, DOE, pedal edema, headaches, dizziness or visual disturbances.  No complications from the medications currently being used.  Last BP : 116/66, 114/84, 136/76,116/64, 136/78, 106/64, 112/66, 100/52, 102/60.  PAST MEDICAL HISTORY : Reviewed.  No changes.  CURRENT MEDICATIONS: Reviewed per [facility]  REVIEW OF SYSTEMS:  GENERAL: no change in appetite, no fatigue, no weight changes, no fever, chills or weakness RESPIRATORY: no cough, SOB, DOE, wheezing, hemoptysis CARDIAC: no chest pain, edema or palpitations GI: no abdominal pain, diarrhea, constipation, heart burn, nausea or vomiting  PHYSICAL EXAMINATION  VS:  T 97.5      P 78    RR 22      BP 102/60     POX %     WT (Lb) 208  GENERAL: no acute distress, normal body habitus NECK: supple, trachea midline, no neck masses, no thyroid tenderness, no thyromegaly RESPIRATORY: breathing is even & unlabored, BS CTAB CARDIAC: RRR, no murmur,no extra heart sounds, no edema GI: abdomen soft, normal BS, no masses, no tenderness, no hepatomegaly, no splenomegaly PSYCHIATRIC: the patient is alert & oriented to person, affect & behavior appropriate  LABS/RADIOLOGY:  9-14 hemoglobin 10.8, MCV 85 otherwise CBC normal, glucose 140, BUN 43, creatinine 1.77 otherwise CMP normal, HDL 39 otherwise fasting lipid panel normal, magnesium 1.9 3/14 hemoglobin 11.6,  MCV 87 otherwise CBC normal, glucose 198, BUN 36,  creatinine 1.81 otherwise CMP normal, fasting lipid panel normal, magnesium 1.8  ASSESSMENT/PLAN: diabetes mellitus-uncontrolled. Levemir was increased. renovascular hypertension-well-controlled. Hyperlipidemia-well-controlled. hypomagnesemia-well repleted. BPH-denies symptoms. chronic kidney disease-stable.  CPT CODE: 45409

## 2013-01-02 DIAGNOSIS — Z23 Encounter for immunization: Secondary | ICD-10-CM | POA: Diagnosis not present

## 2013-01-13 ENCOUNTER — Non-Acute Institutional Stay (SKILLED_NURSING_FACILITY): Payer: Medicare Other | Admitting: Internal Medicine

## 2013-01-13 DIAGNOSIS — I150 Renovascular hypertension: Secondary | ICD-10-CM

## 2013-01-13 DIAGNOSIS — E1029 Type 1 diabetes mellitus with other diabetic kidney complication: Secondary | ICD-10-CM

## 2013-01-13 DIAGNOSIS — E7800 Pure hypercholesterolemia, unspecified: Secondary | ICD-10-CM | POA: Diagnosis not present

## 2013-01-13 NOTE — Progress Notes (Signed)
PROGRESS NOTE  DATE: 01-13-13  FACILITY: [facility]  LEVEL OF CARE: SNF  Routine Visit  CHIEF COMPLAINT:  Manage diabetes mellitus and hypertension  HISTORY OF PRESENT ILLNESS:  REASSESSMENT OF ONGOING PROBLEM(S):  DM:pt's DM remains stable.  Pt denies polyuria, polydipsia, polyphagia, changes in vision or hypoglycemic episodes.  No complications noted from the medication presently being used.  Last hemoglobin A1c is: 9.3 in 3/14, in 6/14 hemoglobin A1c 8.6, in 9-14 hemoglobin A1c 8.1  HTN: Pt 's HTN remains stable.  Denies CP, sob, DOE, pedal edema, headaches, dizziness or visual disturbances.  No complications from the medications currently being used.  Last BP : 116/66, 114/84, 136/76,116/64, 136/78, 106/64, 112/66, 100/52, 102/60, 136/78.  PAST MEDICAL HISTORY : Reviewed.  No changes.  CURRENT MEDICATIONS: Reviewed per [facility]  REVIEW OF SYSTEMS:  GENERAL: no change in appetite, no fatigue, no weight changes, no fever, chills or weakness RESPIRATORY: no cough, SOB, DOE, wheezing, hemoptysis CARDIAC: no chest pain, edema or palpitations GI: no abdominal pain, diarrhea, constipation, heart burn, nausea or vomiting  PHYSICAL EXAMINATION  VS:  T 96.6      P 77    RR 20      BP 136/78     POX %     WT (Lb) 208  GENERAL: no acute distress, normal body habitus NECK: supple, trachea midline, no neck masses, no thyroid tenderness, no thyromegaly RESPIRATORY: breathing is even & unlabored, BS CTAB CARDIAC: RRR, no murmur,no extra heart sounds, no edema GI: abdomen soft, normal BS, no masses, no tenderness, no hepatomegaly, no splenomegaly PSYCHIATRIC: the patient is alert & oriented to person, affect & behavior appropriate  LABS/RADIOLOGY:  9-14 hemoglobin 10.8, MCV 85 otherwise CBC normal, glucose 140, BUN 43, creatinine 1.77 otherwise CMP normal, HDL 39 otherwise fasting lipid panel normal, magnesium 1.9 3/14 hemoglobin 11.6,  MCV 87 otherwise CBC normal, glucose 198, BUN  36, creatinine 1.81 otherwise CMP normal, fasting lipid panel normal, magnesium 1.8  ASSESSMENT/PLAN:  diabetes mellitus-uncontrolled. Levemir was increased. renovascular hypertension-well-controlled. Hyperlipidemia-well-controlled. hypomagnesemia-well repleted. BPH-denies symptoms. chronic kidney disease-stable.  CPT CODE: 16109

## 2013-02-10 DIAGNOSIS — F329 Major depressive disorder, single episode, unspecified: Secondary | ICD-10-CM | POA: Diagnosis not present

## 2013-02-11 ENCOUNTER — Encounter: Payer: Self-pay | Admitting: Adult Health

## 2013-02-11 MED ORDER — INSULIN ASPART 100 UNIT/ML ~~LOC~~ SOLN: 5.0000 [IU] | Freq: Three times a day (TID) | SUBCUTANEOUS | Status: AC

## 2013-02-11 MED ORDER — INSULIN DETEMIR 100 UNIT/ML ~~LOC~~ SOLN: 10.0000 [IU] | Freq: Every day | SUBCUTANEOUS | Status: AC

## 2013-02-11 NOTE — Progress Notes (Signed)
Patient ID: Louis English, male   DOB: April 07, 1955, 57 y.o.   MRN: [id number]      [facility]  No Known Allergies   Chief Complaint  Patient presents with  . Acute Visit    follow up diabetes     HPI: He is being seen for the management of his diabetes. His cbg's have all been elevated. He is presently taking levemir 45 units nightly with novolog 5 units prior to meals with an additional 5 units as needed for cbg >=150. He will require medication adjustment.  His hypertension has been managed at this time.    Past Medical History  Diagnosis Date  . Blind in both eyes   . Diabetes mellitus   . Hypertension   . Renal disorder   . Anemia   . GI (gastrointestinal bleed)   . Hyperlipidemia   . 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 Chronic kidney disease, unspecified 12/12/2012  . Anemia in chronic kidney disease(285.21) 12/12/2012  . 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 Pure hypercholesterolemia 07/18/2012  . GERD (gastroesophageal reflux disease) 08/07/2012    No past surgical history on file.  VITAL SIGNS BP 112/74  Pulse 69  Ht 5\' 10"  (1.778 m)  Wt 206 lb (93.441 kg)  BMI 29.56 kg/m2   Patient's Medications  New Prescriptions   No medications on file  Previous Medications   ASPIRIN 81 MG CHEWABLE TABLET    Chew 81 mg by mouth daily.   CLONIDINE (CATAPRES) 0.1 MG TABLET    Take 0.1 mg by mouth every 6 (six) hours as needed. For blood pressure   ESCITALOPRAM (LEXAPRO) 20 MG TABLET    Take 40 mg by mouth See admin instructions.    HYDRALAZINE (APRESOLINE) 25 MG TABLET    Take 25 mg by mouth 3 (three) times daily.   INSULIN ASPART (NOVOLOG) 100 UNIT/ML INJECTION    Inject 5 Units into the skin 3 (three) times daily with meals. Takes 5 units with  Meals with an additional 5 units for cbg >=150   INSULIN DETEMIR (LEVEMIR) 100 UNIT/ML INJECTION    Inject 45 Units into the skin at  bedtime.   MAGNESIUM OXIDE (MAG-OX) 400 MG TABLET    Take 400 mg by mouth 2 (two) times daily.   METOCLOPRAMIDE (REGLAN) 10 MG TABLET    Take 10 mg by mouth 4 (four) times daily -  with meals and at bedtime.   METOPROLOL TARTRATE (LOPRESSOR) 25 MG TABLET    Take 100 mg by mouth 2 (two) times daily.    MULTIPLE VITAMINS-MINERALS (CERTAVITE/ANTIOXIDANTS) TABS    Take 1 tablet by mouth daily.   PANTOPRAZOLE (PROTONIX) 40 MG TABLET    Take 1 tablet (40 mg total) by mouth daily.   POTASSIUM CHLORIDE SA (K-DUR,KLOR-CON) 20 MEQ TABLET    Take 20 mEq by mouth daily.   SIMVASTATIN (ZOCOR) 40 MG TABLET    Take 40 mg by mouth at bedtime.   TAMSULOSIN HCL (FLOMAX) 0.4 MG CAPS    Take 0.4 mg by mouth daily.   VITAMIN C (ASCORBIC ACID) 500 MG TABLET    Take 500 mg by mouth 2 (two) times daily.  Modified Medications   No medications on file  Discontinued Medications   DOCUSATE SODIUM (COLACE) 100 MG CAPSULE    Take 100 mg by mouth as needed. For constipation   ESCITALOPRAM (LEXAPRO) 10 MG TABLET  Take 20 mg by mouth See admin instructions. Take 10mg  with 20mg  to equal total amount of 30mg .   FEEDING SUPPLEMENT (PRO-STAT SUGAR FREE 64) LIQD    Take 30 mLs by mouth 2 (two) times daily.   FLUDROCORTISONE (FLORINEF) 0.1 MG TABLET    Take 0.1 mg by mouth daily.   HYDROCHLOROTHIAZIDE (HYDRODIURIL) 25 MG TABLET    Take 25 mg by mouth daily.   INSULIN ASPART (NOVOLOG) 100 UNIT/ML INJECTION    Inject 2-10 Units into the skin daily. In the morning ----Sliding scale: 201-250=2 units, 251-300=4 units, 301-350=6 units, 351-400=8 units, 401-450=10 units, >350 after 2 hours or <60 = call MD   INSULIN GLARGINE (LANTUS) 100 UNIT/ML INJECTION    Inject 23 Units into the skin at bedtime.   LISINOPRIL (PRINIVIL,ZESTRIL) 10 MG TABLET    Take 10 mg by mouth daily.   NUTRITIONAL SUPPLEMENTS (BOOST GLUCOSE CONTROL PO)    Take 1 each by mouth 3 (three) times daily.   OXYCODONE (OXY IR/ROXICODONE) 5 MG IMMEDIATE RELEASE TABLET     Take 5 mg by mouth every 6 (six) hours as needed. For pain    SIGNIFICANT DIAGNOSTIC EXAMS   LABS REVIEWED:   05-20-12: wbc 4.4 ;hgb 11.6; hct 36.4; mcv 87; plt 175; glucose 198; bun 36; creat 4.9; k+4.9; na++138 Liver normal albumin 4.0; chol 135; ldl 78; trig 123; hgb a1c 9.3   Review of Systems  Constitutional: Negative for malaise/fatigue.  Respiratory: Negative for cough.   Cardiovascular: Negative for chest pain.  Gastrointestinal: Negative for heartburn and abdominal pain.  Musculoskeletal: Negative for myalgias.  Skin: Negative.   Neurological: Negative for headaches.  Psychiatric/Behavioral: The patient is not nervous/anxious.           Physical Exam  Constitutional: No distress.  Overweight   Neck: Neck supple. No JVD present.  Cardiovascular: Normal rate, regular rhythm and intact distal pulses.   Respiratory: Effort normal. No respiratory distress. He has no wheezes.  GI: Soft. Bowel sounds are normal. He exhibits no distension. There is no tenderness.  Musculoskeletal: He exhibits no edema.  Neurological: He is alert.  Skin: Skin is warm and dry. He is not diaphoretic.     ASSESSMENT/PLAN  1. Diabetes: will add am dose of levemir 10 units daily; will change novolog to 8 units prior to meals with an additional 5 units for cbg >=150  2. Hypertension: is stable will continue lopressor 100 mg twice daily; apresoline 25 mg three times daily; and asa 81 mg daily and will monitor his status

## 2013-02-19 DIAGNOSIS — E119 Type 2 diabetes mellitus without complications: Secondary | ICD-10-CM | POA: Diagnosis not present

## 2013-02-23 DIAGNOSIS — E11359 Type 2 diabetes mellitus with proliferative diabetic retinopathy without macular edema: Secondary | ICD-10-CM | POA: Diagnosis not present

## 2013-02-23 DIAGNOSIS — H2520 Age-related cataract, morgagnian type, unspecified eye: Secondary | ICD-10-CM | POA: Diagnosis not present

## 2013-02-24 ENCOUNTER — Encounter: Payer: Self-pay | Admitting: Internal Medicine

## 2013-02-24 ENCOUNTER — Non-Acute Institutional Stay (SKILLED_NURSING_FACILITY): Payer: Medicare Other | Admitting: Internal Medicine

## 2013-02-24 DIAGNOSIS — I150 Renovascular hypertension: Secondary | ICD-10-CM | POA: Diagnosis not present

## 2013-02-24 DIAGNOSIS — E1129 Type 2 diabetes mellitus with other diabetic kidney complication: Secondary | ICD-10-CM | POA: Diagnosis not present

## 2013-02-24 DIAGNOSIS — E7800 Pure hypercholesterolemia, unspecified: Secondary | ICD-10-CM | POA: Diagnosis not present

## 2013-02-24 NOTE — Progress Notes (Signed)
PROGRESS NOTE  DATE: 02-24-13  FACILITY: [facility]  LEVEL OF CARE: SNF  Routine Visit  CHIEF COMPLAINT:  Manage diabetes mellitus, BPH and hypertension  HISTORY OF PRESENT ILLNESS:  REASSESSMENT OF ONGOING PROBLEM(S):  DM:pt's DM remains stable. Staff deny polyuria, polydipsia, polyphagia, changes in vision or hypoglycemic episodes.  No complications noted from the medication presently being used.  Last hemoglobin A1c is: 9.3 in 3/14, in 6/14 hemoglobin A1c 8.6, in 9-14 hemoglobin A1c 8.1, and 12/14 hemoglobin A1c 7.4. Patient is a poor historian  HTN: Pt 's HTN remains stable.  Staff Deny CP, sob, DOE, pedal edema, headaches, dizziness or visual disturbances.  No complications from the medications currently being used.  Last BP : 116/66, 114/84, 136/76,116/64, 136/78, 106/64, 112/66, 100/52, 102/60, 136/78, 128/72.  BPH: The patient's BPH remains stable. Staff deny urinary hesitancy or dribbling. No complications reported from the current medications being used.  PAST MEDICAL HISTORY : Reviewed.  No changes.  CURRENT MEDICATIONS: Reviewed per [facility]  REVIEW OF SYSTEMS: Unobtainable secondary to patient being a poor historian  PHYSICAL EXAMINATION  VS:  T 97.9      P 69    RR 14      BP 128/72     POX %     WT (Lb) 219  GENERAL: no acute distress, normal body habitus EYES: Normal sclerae, normal conjunctivae, no discharge NECK: supple, trachea midline, no neck masses, no thyroid tenderness, no thyromegaly LYMPHATICS: No cervical lymphadenopathy, no supraclavicular lymphadenopathy RESPIRATORY: breathing is even & unlabored, BS CTAB CARDIAC: RRR, no murmur,no extra heart sounds, no edema GI: abdomen soft, normal BS, no masses, no tenderness, no hepatomegaly, no splenomegaly PSYCHIATRIC: the patient is alert & oriented to person, affect & behavior appropriate  LABS/RADIOLOGY:  9-14 hemoglobin 10.8, MCV 85 otherwise CBC normal, glucose 140, BUN 43, creatinine 1.77  otherwise CMP normal, HDL 39 otherwise fasting lipid panel normal, magnesium 1.9 3/14 hemoglobin 11.6,  MCV 87 otherwise CBC normal, glucose 198, BUN 36, creatinine 1.81 otherwise CMP normal, fasting lipid panel normal, magnesium 1.8  ASSESSMENT/PLAN:  diabetes mellitus-uncontrolled. Increase Levemir to 58 units each bedtime renovascular hypertension-well-controlled. Hyperlipidemia-well-controlled. hypomagnesemia-well repleted. BPH-denies symptoms. chronic kidney disease-stable.  CPT CODE: 21308

## 2013-03-17 ENCOUNTER — Non-Acute Institutional Stay (SKILLED_NURSING_FACILITY): Payer: Medicare Other | Admitting: Internal Medicine

## 2013-03-17 DIAGNOSIS — E1129 Type 2 diabetes mellitus with other diabetic kidney complication: Secondary | ICD-10-CM | POA: Diagnosis not present

## 2013-03-17 DIAGNOSIS — I150 Renovascular hypertension: Secondary | ICD-10-CM | POA: Diagnosis not present

## 2013-03-17 DIAGNOSIS — E7800 Pure hypercholesterolemia, unspecified: Secondary | ICD-10-CM

## 2013-03-17 DIAGNOSIS — E1165 Type 2 diabetes mellitus with hyperglycemia: Principal | ICD-10-CM

## 2013-03-20 NOTE — Progress Notes (Signed)
         PROGRESS NOTE  DATE: 1- 6-15  FACILITY: [facility]  LEVEL OF CARE: SNF  Routine Visit  CHIEF COMPLAINT:  Manage diabetes mellitus, BPH and hypertension  HISTORY OF PRESENT ILLNESS:  REASSESSMENT OF ONGOING PROBLEM(S):  DM:pt's DM remains stable. Staff deny polyuria, polydipsia, polyphagia, changes in vision or hypoglycemic episodes.  No complications noted from the medication presently being used.  Last hemoglobin A1c is: 9.3 in 3/14, in 6/14 hemoglobin A1c 8.6, in 9-14 hemoglobin A1c 8.1, and 12/14 hemoglobin A1c 7.4. Patient is a poor historian  HTN: Pt 's HTN remains stable.  Staff Deny CP, sob, DOE, pedal edema, headaches, dizziness or visual disturbances.  No complications from the medications currently being used.  Last BP : 116/66, 114/84, 136/76,116/64, 136/78, 106/64, 112/66, 100/52, 102/60, 136/78, 128/72, 128/76.  BPH: The patient's BPH remains stable. Staff deny urinary hesitancy or dribbling. No complications reported from the current medications being used.  PAST MEDICAL HISTORY : Reviewed.  No changes.  [facility]S: [facility]  REVIEW OF SYSTEMS: Unobtainable secondary to patient being a poor historian  PHYSICAL EXAMINATION  VS:  T 98.1      P 68    RR 18      BP 128/76     WT (Lb) 219  GENERAL: no acute distress, normal body habitus NECK: supple, trachea midline, no neck masses, no thyroid tenderness, no thyromegaly RESPIRATORY: breathing is even & unlabored, BS CTAB CARDIAC: RRR, no murmur,no extra heart sounds, no edema GI: abdomen soft, normal BS, no masses, no tenderness, no hepatomegaly, no splenomegaly PSYCHIATRIC: the patient is alert & oriented to person, affect & behavior appropriate  LABS/RADIOLOGY:  9-14 hemoglobin 10.8, MCV 85 otherwise CBC normal, glucose 140, BUN 43, creatinine 1.77 otherwise CMP normal, HDL 39 otherwise fasting lipid panel normal, magnesium 1.9 3/14 hemoglobin 11.6,  MCV 87 otherwise CBC normal, glucose  198, BUN 36, creatinine 1.81 otherwise CMP normal, fasting lipid panel normal, magnesium 1.8  ASSESSMENT/PLAN:  diabetes mellitus-uncontrolled. Ievemir increased renovascular hypertension-well-controlled. Hyperlipidemia-well-controlled. hypomagnesemia-well repleted. BPH-denies symptoms. chronic kidney disease-stable.  CPT CODE: [id number]

## 2013-04-13 ENCOUNTER — Other Ambulatory Visit: Payer: Self-pay | Admitting: Vascular Surgery

## 2013-04-13 DIAGNOSIS — I739 Peripheral vascular disease, unspecified: Secondary | ICD-10-CM

## 2013-04-13 DIAGNOSIS — Z48812 Encounter for surgical aftercare following surgery on the circulatory system: Secondary | ICD-10-CM

## 2013-05-12 DIAGNOSIS — F329 Major depressive disorder, single episode, unspecified: Secondary | ICD-10-CM | POA: Diagnosis not present

## 2013-05-12 DIAGNOSIS — F3289 Other specified depressive episodes: Secondary | ICD-10-CM | POA: Diagnosis not present

## 2013-05-20 ENCOUNTER — Encounter: Payer: Self-pay | Admitting: Family

## 2013-05-21 ENCOUNTER — Ambulatory Visit: Payer: Medicare Other | Admitting: Family

## 2013-05-21 ENCOUNTER — Inpatient Hospital Stay (HOSPITAL_COMMUNITY)
Admission: RE | Admit: 2013-05-21 | Discharge: 2013-05-21 | Disposition: A | Discharge status: Home / Self Care | Payer: Medicare Other | Source: Ambulatory Visit

## 2013-05-21 ENCOUNTER — Inpatient Hospital Stay (INDEPENDENT_AMBULATORY_CARE_PROVIDER_SITE_OTHER)
Admission: RE | Admit: 2013-05-21 | Discharge: 2013-05-21 | Disposition: A | Discharge status: Home / Self Care | Payer: Medicare Other | Source: Ambulatory Visit

## 2013-05-21 DIAGNOSIS — I739 Peripheral vascular disease, unspecified: Secondary | ICD-10-CM

## 2013-05-21 DIAGNOSIS — Z48812 Encounter for surgical aftercare following surgery on the circulatory system: Secondary | ICD-10-CM

## 2013-05-21 DIAGNOSIS — E1159 Type 2 diabetes mellitus with other circulatory complications: Secondary | ICD-10-CM | POA: Diagnosis not present

## 2013-05-22 ENCOUNTER — Other Ambulatory Visit (HOSPITAL_COMMUNITY): Payer: Medicare Other

## 2013-05-22 ENCOUNTER — Encounter (HOSPITAL_COMMUNITY): Payer: Medicare Other

## 2013-05-22 ENCOUNTER — Ambulatory Visit: Payer: Medicare Other | Admitting: Family

## 2013-06-02 ENCOUNTER — Encounter: Payer: Self-pay | Admitting: Family

## 2013-06-02 DIAGNOSIS — D649 Anemia, unspecified: Secondary | ICD-10-CM | POA: Diagnosis not present

## 2013-06-02 DIAGNOSIS — E119 Type 2 diabetes mellitus without complications: Secondary | ICD-10-CM | POA: Diagnosis not present

## 2013-06-02 DIAGNOSIS — Z79899 Other long term (current) drug therapy: Secondary | ICD-10-CM | POA: Diagnosis not present

## 2013-06-02 DIAGNOSIS — E7800 Pure hypercholesterolemia, unspecified: Secondary | ICD-10-CM | POA: Diagnosis not present

## 2013-06-03 ENCOUNTER — Ambulatory Visit (INDEPENDENT_AMBULATORY_CARE_PROVIDER_SITE_OTHER): Payer: Medicare Other | Admitting: Family

## 2013-06-03 ENCOUNTER — Ambulatory Visit (HOSPITAL_COMMUNITY)
Admission: RE | Admit: 2013-06-03 | Discharge: 2013-06-03 | Disposition: A | Discharge status: Home / Self Care | Payer: Medicare Other | Source: Ambulatory Visit | Attending: Family | Admitting: Family

## 2013-06-03 ENCOUNTER — Ambulatory Visit (HOSPITAL_COMMUNITY)
Admission: RE | Admit: 2013-06-03 | Discharge: 2013-06-03 | Disposition: A | Discharge status: Home / Self Care | Payer: Medicare Other | Source: Ambulatory Visit | Attending: Vascular Surgery | Admitting: Vascular Surgery

## 2013-06-03 ENCOUNTER — Encounter: Payer: Self-pay | Admitting: Family

## 2013-06-03 VITALS — BP 115/78 | HR 63 | Resp 16 | Ht 70.0 in | Wt 213.0 lb

## 2013-06-03 DIAGNOSIS — I739 Peripheral vascular disease, unspecified: Secondary | ICD-10-CM | POA: Insufficient documentation

## 2013-06-03 DIAGNOSIS — R0902 Hypoxemia: Secondary | ICD-10-CM | POA: Diagnosis not present

## 2013-06-03 DIAGNOSIS — R0602 Shortness of breath: Secondary | ICD-10-CM | POA: Diagnosis not present

## 2013-06-03 NOTE — Progress Notes (Signed)
[facility] [place] HISTORY AND PHYSICAL -PAD  History of Present Illness Louis English is a 58 y.o. male patient who is s/p right popliteal to ATA graft on 03/11/2006 and s/p left BKA. He returns for routine surveillance. He is a resident of [facility].    ` He is blind, wheelchair bound, staff member from the rehabilitation center is with him. Staff member reports that patient does not have non healing wounds.  Pt Diabetic: Yes Pt smoker: former-smoker  Pt meds include: Statin :Yes Betablocker: Yes ASA: Yes Other anticoagulants/antiplatelets: no  Past Medical History  Diagnosis Date  . Blind in both eyes   . Diabetes mellitus   . Hypertension   . Renal disorder   . Anemia   . GI (gastrointestinal bleed)   . Hyperlipidemia   . 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 Chronic kidney disease, unspecified 12/12/2012  . Anemia in chronic kidney disease(285.21) 12/12/2012  . 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 Pure hypercholesterolemia 07/18/2012  . GERD (gastroesophageal reflux disease) 08/07/2012    Social History History  Substance Use Topics  . Smoking status: Former Smoker -- 15 years  . Smokeless tobacco: Not on file  . Alcohol Use: No    Family History No family history on file.  History reviewed. No pertinent past surgical history.  No Known Allergies  Current Outpatient Prescriptions  Medication Sig Dispense Refill  . aspirin 81 MG chewable tablet Chew 81 mg by mouth daily.      . cloNIDine (CATAPRES) 0.1 MG tablet Take 0.1 mg by mouth every 6 (six) hours as needed. For blood pressure      . escitalopram (LEXAPRO) 20 MG tablet Take 40 mg by mouth See admin instructions.       . hydrALAZINE (APRESOLINE) 25 MG tablet Take 25 mg by mouth 3 (three) times daily.      . insulin aspart (NOVOLOG) 100 UNIT/ML injection Inject 5-8  Units into the skin 3 (three) times daily with meals. Takes 8 units with  Meals with an additional 5 units for cbg >=150  1 vial  11  . insulin detemir (LEVEMIR) 100 UNIT/ML injection Inject 0.1-0.45 mLs (10-45 Units total) into the skin at bedtime. Give 10 units in the AM and 45 units in the PM  10 mL  11  . magnesium oxide (MAG-OX) 400 MG tablet Take 400 mg by mouth 2 (two) times daily.      . metoCLOPramide (REGLAN) 10 MG tablet Take 10 mg by mouth 4 (four) times daily -  with meals and at bedtime.      . metoprolol tartrate (LOPRESSOR) 25 MG tablet Take 100 mg by mouth 2 (two) times daily.       . Multiple Vitamins-Minerals (CERTAVITE/ANTIOXIDANTS) TABS Take 1 tablet by mouth daily.      . potassium chloride SA (K-DUR,KLOR-CON) 20 MEQ tablet Take 20 mEq by mouth daily.      . simvastatin (ZOCOR) 40 MG tablet Take 40 mg by mouth at bedtime.      . Tamsulosin HCl (FLOMAX) 0.4 MG CAPS Take 0.4 mg by mouth daily.      . vitamin C (ASCORBIC ACID) 500 MG tablet Take 500 mg by mouth 2 (two) times daily.      . pantoprazole (PROTONIX) 40 MG tablet Take 1 tablet (40 mg total) by mouth daily.  30 tablet  0  No current facility-administered medications for this visit.    ROS: See HPI for pertinent positives and negatives.   Physical Examination  Filed Vitals:   06/03/13 1202  BP: 115/78  Pulse: 63  Resp: 16   Filed Weights   06/03/13 1202  Weight: 213 lb (96.616 kg)   Body mass index is 30.56 kg/(m^2).   General: A&O x 3, WDWN, obese male. Mouth: Thick plaque and tartar on lower teeth. Few upper teeth remaining, advanced stages of decay for some teeth. Gait: in wheelchair Eyes: eyelids closed, squints when speaking Pulmonary: CTAB, without wheezes , rales or rhonchi. Cardiac: regular Rythm , without detected murmur.         Carotid Bruits Left Right   Negative Negative  Aorta is not palpable. Radial pulses: right is 1+, left radial and ulnar pulses not palpable, left brachial  pulse is faintly palpable                           VASCULAR EXAM: Extremities without ischemic changes  without Gangrene; without open wounds. Left BKA. Right first through third toes are surgically absent.                                                                                                          LE Pulses LEFT RIGHT       POPLITEAL  not palpable   not palpable       POSTERIOR TIBIAL BKA  not palpable        DORSALIS PEDIS      ANTERIOR TIBIAL BKA not palpable    Abdomen: soft, NT, no masses. Skin: no rashes, no ulcers noted. Musculoskeletal:  muscle wasting and atrophy of right lower leg.  Neurologic: A&O X 3; Appropriate Affect ; SENSATION: normal; MOTOR FUNCTION:  moving all extremities equally. Speech is fluent/normal. CN 2-12 grossly intact.   Non-Invasive Vascular Imaging: DATE: 06/03/2013 ABI: RIGHT 0.96; previous (03/21/2010): 0.95 DUPLEX SCAN OF RLE BYPASS:  Waveforms are all triphasic with stenosis >50% of proximal segment. Absolute velocities are stable compared to previous exam.  ASSESSMENT: Louis English is a 58 y.o. 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 He has no non healing wounds, no gangrene, and he does not walk. His right ABI is normal, RLE stenosis is stable. Some of his teeth are in advanced stages of decay and area potential source of infection. Severe plaque and tartar accumulations on teeth.  PLAN:  I discussed in depth with the patient the nature of atherosclerosis, and emphasized the importance of maximal medical management including strict control of blood pressure, blood glucose, and lipid levels, obtaining regular exercise, and continued cessation of smoking.  The patient is aware that without maximal medical management the underlying atherosclerotic disease process will progress, limiting the benefit of any interventions.  Based on the patient's vascular studies and examination, pt will return  to clinic in 1 year for RLE arterial Duplex.  The patient was given information  about PAD including signs, symptoms, treatment, what symptoms should prompt the patient to seek immediate medical care, and risk reduction measures to take.  Charisse March, RN, MSN, FNP-C [facility] [place] Phone: [phone]  Clinic MD: Edilia Bo  06/03/2013 12:16 PM

## 2013-06-03 NOTE — Patient Instructions (Signed)
Peripheral Vascular Disease Peripheral Vascular Disease (PVD), also called Peripheral Arterial Disease (PAD), is a circulation problem caused by cholesterol (atherosclerotic plaque) deposits in the arteries. PVD commonly occurs in the lower extremities (legs) but it can occur in other areas of the body, such as your arms. The cholesterol buildup in the arteries reduces blood flow which can cause pain and other serious problems. The presence of PVD can place a person at risk for Coronary Artery Disease (CAD).  CAUSES  Causes of PVD can be many. It is usually associated with more than one risk factor such as:   High Cholesterol.  Smoking.  Diabetes.  Lack of exercise or inactivity.  High blood pressure (hypertension).  Obesity.  Family history. SYMPTOMS   When the lower extremities are affected, patients with PVD may experience:  Leg pain with exertion or physical activity. This is called INTERMITTENT CLAUDICATION. This may present as cramping or numbness with physical activity. The location of the pain is associated with the level of blockage. For example, blockage at the abdominal level (distal abdominal aorta) may result in buttock or hip pain. Lower leg arterial blockage may result in calf pain.  As PVD becomes more severe, pain can develop with less physical activity.  In people with severe PVD, leg pain may occur at rest.  Other PVD signs and symptoms:  Leg numbness or weakness.  Coldness in the affected leg or foot, especially when compared to the other leg.  A change in leg color.  Patients with significant PVD are more prone to ulcers or sores on toes, feet or legs. These may take longer to heal or may reoccur. The ulcers or sores can become infected.  If signs and symptoms of PVD are ignored, gangrene may occur. This can result in the loss of toes or loss of an entire limb.  Not all leg pain is related to PVD. Other medical conditions can cause leg pain such  as:  Blood clots (embolism) or Deep Vein Thrombosis.  Inflammation of the blood vessels (vasculitis).  Spinal stenosis. DIAGNOSIS  Diagnosis of PVD can involve several different types of tests. These can include:  Pulse Volume Recording Method (PVR). This test is simple, painless and does not involve the use of X-rays. PVR involves measuring and comparing the blood pressure in the arms and legs. An ABI (Ankle-Brachial Index) is calculated. The normal ratio of blood pressures is 1. As this number becomes smaller, it indicates more severe disease.  < 0.95  indicates significant narrowing in one or more leg vessels.  <0.8 there will usually be pain in the foot, leg or buttock with exercise.  <0.4 will usually have pain in the legs at rest.  <0.25  usually indicates limb threatening PVD.  Doppler detection of pulses in the legs. This test is painless and checks to see if you have a pulses in your legs/feet.  A dye or contrast material (a substance that highlights the blood vessels so they show up on x-ray) may be given to help your caregiver better see the arteries for the following tests. The dye is eliminated from your body by the kidney's. Your caregiver may order blood work to check your kidney function and other laboratory values before the following tests are performed:  Magnetic Resonance Angiography (MRA). An MRA is a picture study of the blood vessels and arteries. The MRA machine uses a large magnet to produce images of the blood vessels.  Computed Tomography Angiography (CTA). A CTA is a   specialized x-ray that looks at how the blood flows in your blood vessels. An IV may be inserted into your arm so contrast dye can be injected.  Angiogram. Is a procedure that uses x-rays to look at your blood vessels. This procedure is minimally invasive, meaning a small incision (cut) is made in your groin. A small tube (catheter) is then inserted into the artery of your groin. The catheter is  guided to the blood vessel or artery your caregiver wants to examine. Contrast dye is injected into the catheter. X-rays are then taken of the blood vessel or artery. After the images are obtained, the catheter is taken out. TREATMENT  Treatment of PVD involves many interventions which may include:  Lifestyle changes:  Quitting smoking.  Exercise.  Following a low fat, low cholesterol diet.  Control of diabetes.  Foot care is very important to the PVD patient. Good foot care can help prevent infection.  Medication:  Cholesterol-lowering medicine.  Blood pressure medicine.  Anti-platelet drugs.  Certain medicines may reduce symptoms of Intermittent Claudication.  Interventional/Surgical options:  Angioplasty. An Angioplasty is a procedure that inflates a balloon in the blocked artery. This opens the blocked artery to improve blood flow.  Stent Implant. A wire mesh tube (stent) is placed in the artery. The stent expands and stays in place, allowing the artery to remain open.  Peripheral Bypass Surgery. This is a surgical procedure that reroutes the blood around a blocked artery to help improve blood flow. This type of procedure may be performed if Angioplasty or stent implants are not an option. SEEK IMMEDIATE MEDICAL CARE IF:   You develop pain or numbness in your arms or legs.  Your arm or leg turns cold, becomes blue in color.  You develop redness, warmth, swelling and pain in your arms or legs. MAKE SURE YOU:   Understand these instructions.  Will watch your condition.  Will get help right away if you are not doing well or get worse. Document Released: 04/05/2004 Document Revised: 05/21/2011 Document Reviewed: 03/02/2008 Exit[employer] Patient Information 2014 [employer].  

## 2013-06-03 NOTE — Addendum Note (Signed)
Addended by: Adria DillELDRIDGE-LEWIS, Kobie Whidby L on: 06/03/2013 02:45 PM   Modules accepted: Orders

## 2013-06-19 DIAGNOSIS — R1312 Dysphagia, oropharyngeal phase: Secondary | ICD-10-CM | POA: Diagnosis not present

## 2013-06-19 DIAGNOSIS — S88119A Complete traumatic amputation at level between knee and ankle, unspecified lower leg, initial encounter: Secondary | ICD-10-CM | POA: Diagnosis not present

## 2013-06-19 DIAGNOSIS — J189 Pneumonia, unspecified organism: Secondary | ICD-10-CM | POA: Diagnosis not present

## 2013-06-22 DIAGNOSIS — J189 Pneumonia, unspecified organism: Secondary | ICD-10-CM | POA: Diagnosis not present

## 2013-06-22 DIAGNOSIS — R1312 Dysphagia, oropharyngeal phase: Secondary | ICD-10-CM | POA: Diagnosis not present

## 2013-06-22 DIAGNOSIS — S88119A Complete traumatic amputation at level between knee and ankle, unspecified lower leg, initial encounter: Secondary | ICD-10-CM | POA: Diagnosis not present

## 2013-06-23 DIAGNOSIS — R1312 Dysphagia, oropharyngeal phase: Secondary | ICD-10-CM | POA: Diagnosis not present

## 2013-06-23 DIAGNOSIS — J189 Pneumonia, unspecified organism: Secondary | ICD-10-CM | POA: Diagnosis not present

## 2013-06-23 DIAGNOSIS — F329 Major depressive disorder, single episode, unspecified: Secondary | ICD-10-CM | POA: Diagnosis not present

## 2013-06-23 DIAGNOSIS — F3289 Other specified depressive episodes: Secondary | ICD-10-CM | POA: Diagnosis not present

## 2013-06-23 DIAGNOSIS — S88119A Complete traumatic amputation at level between knee and ankle, unspecified lower leg, initial encounter: Secondary | ICD-10-CM | POA: Diagnosis not present

## 2013-06-24 DIAGNOSIS — J189 Pneumonia, unspecified organism: Secondary | ICD-10-CM | POA: Diagnosis not present

## 2013-06-24 DIAGNOSIS — R1312 Dysphagia, oropharyngeal phase: Secondary | ICD-10-CM | POA: Diagnosis not present

## 2013-06-24 DIAGNOSIS — S88119A Complete traumatic amputation at level between knee and ankle, unspecified lower leg, initial encounter: Secondary | ICD-10-CM | POA: Diagnosis not present

## 2013-06-25 DIAGNOSIS — J189 Pneumonia, unspecified organism: Secondary | ICD-10-CM | POA: Diagnosis not present

## 2013-06-25 DIAGNOSIS — S88119A Complete traumatic amputation at level between knee and ankle, unspecified lower leg, initial encounter: Secondary | ICD-10-CM | POA: Diagnosis not present

## 2013-06-25 DIAGNOSIS — R1312 Dysphagia, oropharyngeal phase: Secondary | ICD-10-CM | POA: Diagnosis not present

## 2013-06-26 DIAGNOSIS — R1312 Dysphagia, oropharyngeal phase: Secondary | ICD-10-CM | POA: Diagnosis not present

## 2013-06-26 DIAGNOSIS — S88119A Complete traumatic amputation at level between knee and ankle, unspecified lower leg, initial encounter: Secondary | ICD-10-CM | POA: Diagnosis not present

## 2013-06-26 DIAGNOSIS — J189 Pneumonia, unspecified organism: Secondary | ICD-10-CM | POA: Diagnosis not present

## 2013-06-29 DIAGNOSIS — S88119A Complete traumatic amputation at level between knee and ankle, unspecified lower leg, initial encounter: Secondary | ICD-10-CM | POA: Diagnosis not present

## 2013-06-29 DIAGNOSIS — J189 Pneumonia, unspecified organism: Secondary | ICD-10-CM | POA: Diagnosis not present

## 2013-06-29 DIAGNOSIS — R1312 Dysphagia, oropharyngeal phase: Secondary | ICD-10-CM | POA: Diagnosis not present

## 2013-06-30 DIAGNOSIS — S88119A Complete traumatic amputation at level between knee and ankle, unspecified lower leg, initial encounter: Secondary | ICD-10-CM | POA: Diagnosis not present

## 2013-06-30 DIAGNOSIS — J189 Pneumonia, unspecified organism: Secondary | ICD-10-CM | POA: Diagnosis not present

## 2013-06-30 DIAGNOSIS — R1312 Dysphagia, oropharyngeal phase: Secondary | ICD-10-CM | POA: Diagnosis not present

## 2013-07-01 DIAGNOSIS — S88119A Complete traumatic amputation at level between knee and ankle, unspecified lower leg, initial encounter: Secondary | ICD-10-CM | POA: Diagnosis not present

## 2013-07-01 DIAGNOSIS — J189 Pneumonia, unspecified organism: Secondary | ICD-10-CM | POA: Diagnosis not present

## 2013-07-01 DIAGNOSIS — R1312 Dysphagia, oropharyngeal phase: Secondary | ICD-10-CM | POA: Diagnosis not present

## 2013-07-02 DIAGNOSIS — S88119A Complete traumatic amputation at level between knee and ankle, unspecified lower leg, initial encounter: Secondary | ICD-10-CM | POA: Diagnosis not present

## 2013-07-02 DIAGNOSIS — R1312 Dysphagia, oropharyngeal phase: Secondary | ICD-10-CM | POA: Diagnosis not present

## 2013-07-02 DIAGNOSIS — J189 Pneumonia, unspecified organism: Secondary | ICD-10-CM | POA: Diagnosis not present

## 2013-07-03 DIAGNOSIS — S88119A Complete traumatic amputation at level between knee and ankle, unspecified lower leg, initial encounter: Secondary | ICD-10-CM | POA: Diagnosis not present

## 2013-07-03 DIAGNOSIS — R1312 Dysphagia, oropharyngeal phase: Secondary | ICD-10-CM | POA: Diagnosis not present

## 2013-07-03 DIAGNOSIS — J189 Pneumonia, unspecified organism: Secondary | ICD-10-CM | POA: Diagnosis not present

## 2013-07-06 DIAGNOSIS — J189 Pneumonia, unspecified organism: Secondary | ICD-10-CM | POA: Diagnosis not present

## 2013-07-06 DIAGNOSIS — S88119A Complete traumatic amputation at level between knee and ankle, unspecified lower leg, initial encounter: Secondary | ICD-10-CM | POA: Diagnosis not present

## 2013-07-06 DIAGNOSIS — R1312 Dysphagia, oropharyngeal phase: Secondary | ICD-10-CM | POA: Diagnosis not present

## 2013-07-07 DIAGNOSIS — J189 Pneumonia, unspecified organism: Secondary | ICD-10-CM | POA: Diagnosis not present

## 2013-07-07 DIAGNOSIS — S88119A Complete traumatic amputation at level between knee and ankle, unspecified lower leg, initial encounter: Secondary | ICD-10-CM | POA: Diagnosis not present

## 2013-07-07 DIAGNOSIS — R1312 Dysphagia, oropharyngeal phase: Secondary | ICD-10-CM | POA: Diagnosis not present

## 2013-07-08 DIAGNOSIS — R1312 Dysphagia, oropharyngeal phase: Secondary | ICD-10-CM | POA: Diagnosis not present

## 2013-07-08 DIAGNOSIS — J189 Pneumonia, unspecified organism: Secondary | ICD-10-CM | POA: Diagnosis not present

## 2013-07-08 DIAGNOSIS — S88119A Complete traumatic amputation at level between knee and ankle, unspecified lower leg, initial encounter: Secondary | ICD-10-CM | POA: Diagnosis not present

## 2013-07-09 DIAGNOSIS — R1312 Dysphagia, oropharyngeal phase: Secondary | ICD-10-CM | POA: Diagnosis not present

## 2013-07-09 DIAGNOSIS — S88119A Complete traumatic amputation at level between knee and ankle, unspecified lower leg, initial encounter: Secondary | ICD-10-CM | POA: Diagnosis not present

## 2013-07-09 DIAGNOSIS — J189 Pneumonia, unspecified organism: Secondary | ICD-10-CM | POA: Diagnosis not present

## 2013-07-22 ENCOUNTER — Non-Acute Institutional Stay (SKILLED_NURSING_FACILITY): Payer: Medicare Other | Admitting: Internal Medicine

## 2013-07-22 DIAGNOSIS — E1165 Type 2 diabetes mellitus with hyperglycemia: Secondary | ICD-10-CM | POA: Diagnosis not present

## 2013-07-22 DIAGNOSIS — E7800 Pure hypercholesterolemia, unspecified: Secondary | ICD-10-CM | POA: Diagnosis not present

## 2013-07-22 DIAGNOSIS — I150 Renovascular hypertension: Secondary | ICD-10-CM | POA: Diagnosis not present

## 2013-07-22 DIAGNOSIS — E1129 Type 2 diabetes mellitus with other diabetic kidney complication: Secondary | ICD-10-CM | POA: Diagnosis not present

## 2013-07-22 NOTE — Progress Notes (Signed)
          PROGRESS NOTE  DATE: 5- 13-15  FACILITY: [facility]  LEVEL OF CARE: SNF  Routine Visit  CHIEF COMPLAINT:  Manage diabetes mellitus, BPH and hypertension  HISTORY OF PRESENT ILLNESS:  REASSESSMENT OF ONGOING PROBLEM(S):  DM:pt's DM remains stable. Staff deny polyuria, polydipsia, polyphagia, changes in vision or hypoglycemic episodes.  No complications noted from the medication presently being used.  Last hemoglobin A1c is: 9.3 in 3/14, in 6/14 hemoglobin A1c 8.6, in 9-14 hemoglobin A1c 8.1, and 12/14 hemoglobin A1c 7.4, in 3-15 hemoglobin A1c 7.4. Patient is a poor historian  HTN: Pt 's HTN remains stable.  Staff Deny CP, sob, DOE, pedal edema, headaches, dizziness or visual disturbances.  No complications from the medications currently being used.  Last BP : 116/66, 114/84, 136/76,116/64, 136/78, 106/64, 112/66, 100/52, 102/60, 136/78, 128/72, 128/76, 123/80  BPH: The patient's BPH remains stable. Staff deny urinary hesitancy or dribbling. No complications reported from the current medications being used.  PAST MEDICAL HISTORY : Reviewed.  No changes.  [facility] MED[facility]S: [facility]  REVIEW OF SYSTEMS: Unobtainable secondary to patient being a poor historian  PHYSICAL EXAMINATION  VS:  See vital signs section  GENERAL: no acute distress, normal body habitus EYES: Normal sclerae, normal conjunctivae, no discharge NECK: supple, trachea midline, no neck masses, no thyroid tenderness, no thyromegaly LYMPHATICS: No cervical lymphadenopathy, no supraclavicular lymphadenopathy RESPIRATORY: breathing is even & unlabored, BS CTAB CARDIAC: RRR, no murmur,no extra heart sounds, no edema GI: abdomen soft, normal BS, no masses, no tenderness, no hepatomegaly, no splenomegaly PSYCHIATRIC: the patient is alert & oriented to person, affect & behavior appropriate  LABS/RADIOLOGY: 3-15 hemoglobin 11.3, MCV 86 otherwise CBC normal, glucose 201, BUN 45, creatinine 1.71  otherwise CMP normal, fasting lipid panel normal, magnesium 2.1 9-14 hemoglobin 10.8, MCV 85 otherwise CBC normal, glucose 140, BUN 43, creatinine 1.77 otherwise CMP normal, HDL 39 otherwise fasting lipid panel normal, magnesium 1.9 3/14 hemoglobin 11.6,  MCV 87 otherwise CBC normal, glucose 198, BUN 36, creatinine 1.81 otherwise CMP normal, fasting lipid panel normal, magnesium 1.8  ASSESSMENT/PLAN:  diabetes mellitus-uncontrolled. Ievemir increased renovascular hypertension-well-controlled. Hyperlipidemia-well-controlled. hypomagnesemia-well repleted. BPH-denies symptoms. chronic kidney disease-stable.  CPT CODE: 1610999309  Newton PiggGayani Y. Kerry Doryasanayaka, MD [facility] [phone]

## 2013-07-30 DIAGNOSIS — E1159 Type 2 diabetes mellitus with other circulatory complications: Secondary | ICD-10-CM | POA: Diagnosis not present

## 2013-08-12 ENCOUNTER — Non-Acute Institutional Stay (SKILLED_NURSING_FACILITY): Payer: Medicare Other | Admitting: Internal Medicine

## 2013-08-12 DIAGNOSIS — E7800 Pure hypercholesterolemia, unspecified: Secondary | ICD-10-CM

## 2013-08-12 DIAGNOSIS — I150 Renovascular hypertension: Secondary | ICD-10-CM | POA: Diagnosis not present

## 2013-08-12 DIAGNOSIS — N400 Enlarged prostate without lower urinary tract symptoms: Secondary | ICD-10-CM | POA: Diagnosis not present

## 2013-08-12 DIAGNOSIS — E1129 Type 2 diabetes mellitus with other diabetic kidney complication: Secondary | ICD-10-CM

## 2013-08-12 DIAGNOSIS — E1165 Type 2 diabetes mellitus with hyperglycemia: Secondary | ICD-10-CM | POA: Diagnosis not present

## 2013-08-12 NOTE — Progress Notes (Signed)
         PROGRESS NOTE  DATE: 6- 3-15  FACILITY: [facility]  LEVEL OF CARE: SNF  Routine Visit  CHIEF COMPLAINT:  Manage diabetes mellitus, BPH and hypertension  HISTORY OF PRESENT ILLNESS:  REASSESSMENT OF ONGOING PROBLEM(S):  DM:pt's DM remains stable. Staff deny polyuria, polydipsia, polyphagia, changes in vision or hypoglycemic episodes.  No complications noted from the medication presently being used.  Last hemoglobin A1c is: 9.3 in 3/14, in 6/14 hemoglobin A1c 8.6, in 9-14 hemoglobin A1c 8.1, and 12/14 hemoglobin A1c 7.4, in 3-15 hemoglobin A1c 7.4. Patient is a poor historian  HTN: Pt 's HTN remains stable.  Staff Deny CP, sob, DOE, pedal edema, headaches, dizziness or visual disturbances.  No complications from the medications currently being used.  Last BP : 116/66, 114/84, 136/76,116/64, 136/78, 106/64, 112/66, 100/52, 102/60, 136/78, 128/72, 128/76, 123/80, 116/58  BPH: The patient's BPH remains stable. Staff deny urinary hesitancy or dribbling. No complications reported from the current medications being used.  PAST MEDICAL HISTORY : Reviewed.  No changes.  CURRENT MEDICATIONS: Reviewed per [facility]  REVIEW OF SYSTEMS: Unobtainable secondary to patient being a poor historian  PHYSICAL EXAMINATION  VS:  See vital signs section  GENERAL: no acute distress, normal body habitus NECK: supple, trachea midline, no neck masses, no thyroid tenderness, no thyromegaly RESPIRATORY: breathing is even & unlabored, BS CTAB CARDIAC: RRR, no murmur,no extra heart sounds, no edema GI: abdomen soft, normal BS, no masses, no tenderness, no hepatomegaly, no splenomegaly PSYCHIATRIC: the patient is alert & oriented to person, affect & behavior appropriate  LABS/RADIOLOGY: 3-15 hemoglobin 11.3, MCV 86 otherwise CBC normal, glucose 201, BUN 45, creatinine 1.71 otherwise CMP normal, fasting lipid panel normal, magnesium 2.1 9-14 hemoglobin 10.8, MCV 85 otherwise CBC normal, glucose 140, BUN  43, creatinine 1.77 otherwise CMP normal, HDL 39 otherwise fasting lipid panel normal, magnesium 1.9 3/14 hemoglobin 11.6,  MCV 87 otherwise CBC normal, glucose 198, BUN 36, creatinine 1.81 otherwise CMP normal, fasting lipid panel normal, magnesium 1.8  ASSESSMENT/PLAN:  diabetes mellitus-uncontrolled. Ievemir increased renovascular hypertension-well-controlled. Hyperlipidemia-well-controlled. hypomagnesemia-well repleted. BPH-denies symptoms. chronic kidney disease-stable.  CPT CODE: 53664  Newton Pigg. Kerry Dory, MD [facility] [phone]

## 2013-08-13 DIAGNOSIS — E119 Type 2 diabetes mellitus without complications: Secondary | ICD-10-CM | POA: Diagnosis not present

## 2013-08-25 DIAGNOSIS — F3289 Other specified depressive episodes: Secondary | ICD-10-CM | POA: Diagnosis not present

## 2013-08-25 DIAGNOSIS — F329 Major depressive disorder, single episode, unspecified: Secondary | ICD-10-CM | POA: Diagnosis not present

## 2013-09-17 ENCOUNTER — Non-Acute Institutional Stay (SKILLED_NURSING_FACILITY): Payer: Medicare Other | Admitting: Internal Medicine

## 2013-09-17 DIAGNOSIS — E1129 Type 2 diabetes mellitus with other diabetic kidney complication: Secondary | ICD-10-CM | POA: Diagnosis not present

## 2013-09-17 DIAGNOSIS — N400 Enlarged prostate without lower urinary tract symptoms: Secondary | ICD-10-CM | POA: Diagnosis not present

## 2013-09-17 DIAGNOSIS — I150 Renovascular hypertension: Secondary | ICD-10-CM

## 2013-09-17 DIAGNOSIS — N189 Chronic kidney disease, unspecified: Secondary | ICD-10-CM

## 2013-09-17 DIAGNOSIS — E1122 Type 2 diabetes mellitus with diabetic chronic kidney disease: Secondary | ICD-10-CM

## 2013-09-17 NOTE — Progress Notes (Signed)
         PROGRESS NOTE  DATE: 7- 9-15  FACILITY: [facility]  LEVEL OF CARE: SNF  Routine Visit  CHIEF COMPLAINT:  Manage diabetes mellitus, BPH and hypertension  HISTORY OF PRESENT ILLNESS:  REASSESSMENT OF ONGOING PROBLEM(S):  DM:pt's DM remains stable. Staff deny polyuria, polydipsia, polyphagia, changes in vision or hypoglycemic episodes.  No complications noted from the medication presently being used.  Last hemoglobin A1c is: 9.3 in 3/14, in 6/14 hemoglobin A1c 8.6, in 9-14 hemoglobin A1c 8.1, and 12/14 hemoglobin A1c 7.4, in 3-15 hemoglobin A1c 7.4, in 6-15 hemoglobin A1c 7. Patient is a poor historian  HTN: Pt 's HTN remains stable.  Staff Deny CP, sob, DOE, pedal edema, headaches, dizziness or visual disturbances.  No complications from the medications currently being used.  Last BP : 116/66, 114/84, 136/76,116/64, 136/78, 106/64, 112/66, 100/52, 102/60, 136/78, 128/72, 128/76, 123/80, 116/58, 122/64  BPH: The patient's BPH remains stable. Staff deny urinary hesitancy or dribbling. No complications reported from the current medications being used.  PAST MEDICAL HISTORY : Reviewed.  No changes.  CURRENT MEDICATIONS: [facility]  REVIEW OF SYSTEMS: Unobtainable secondary to patient being a poor historian  PHYSICAL EXAMINATION  VS:  See vital signs section  GENERAL: no acute distress, normal body habitus NECK: supple, trachea midline, no neck masses, no thyroid tenderness, no thyromegaly RESPIRATORY: breathing is even & unlabored, BS CTAB CARDIAC: RRR, no murmur,no extra heart sounds, no edema GI: abdomen soft, normal BS, no masses, no tenderness, no hepatomegaly, no splenomegaly PSYCHIATRIC: the patient is alert & oriented to person, affect & behavior appropriate  LABS/RADIOLOGY: 3-15 hemoglobin 11.3, MCV 86 otherwise CBC normal, glucose 201, BUN 45, creatinine 1.71 otherwise CMP normal, fasting lipid panel normal, magnesium 2.1 9-14 hemoglobin 10.8, MCV 85  otherwise CBC normal, glucose 140, BUN 43, creatinine 1.77 otherwise CMP normal, HDL 39 otherwise fasting lipid panel normal, magnesium 1.9 3/14 hemoglobin 11.6,  MCV 87 otherwise CBC normal, glucose 198, BUN 36, creatinine 1.81 otherwise CMP normal, fasting lipid panel normal, magnesium 1.8  ASSESSMENT/PLAN:  diabetes mellitus-adequately controlled renovascular hypertension-well-controlled. Hyperlipidemia-well-controlled. hypomagnesemia-well repleted. BPH-denies symptoms. chronic kidney disease-stable.  CPT CODE: [id number]  Newton PiggGayani Y. Kerry Doryasanayaka, MD [facility] [phone]

## 2013-10-15 ENCOUNTER — Non-Acute Institutional Stay (SKILLED_NURSING_FACILITY): Payer: Medicare Other | Admitting: Internal Medicine

## 2013-10-15 DIAGNOSIS — E1129 Type 2 diabetes mellitus with other diabetic kidney complication: Secondary | ICD-10-CM

## 2013-10-15 DIAGNOSIS — E7800 Pure hypercholesterolemia, unspecified: Secondary | ICD-10-CM | POA: Diagnosis not present

## 2013-10-15 DIAGNOSIS — N400 Enlarged prostate without lower urinary tract symptoms: Secondary | ICD-10-CM | POA: Diagnosis not present

## 2013-10-15 DIAGNOSIS — I150 Renovascular hypertension: Secondary | ICD-10-CM | POA: Diagnosis not present

## 2013-10-15 DIAGNOSIS — N189 Chronic kidney disease, unspecified: Secondary | ICD-10-CM

## 2013-10-15 DIAGNOSIS — E1122 Type 2 diabetes mellitus with diabetic chronic kidney disease: Secondary | ICD-10-CM

## 2013-10-17 NOTE — Progress Notes (Signed)
         PROGRESS NOTE  DATE: 8- 6-15  FACILITY: [facility]  LEVEL OF CARE: SNF  Routine Visit  CHIEF COMPLAINT:  Manage diabetes mellitus, BPH and hypertension  HISTORY OF PRESENT ILLNESS:  REASSESSMENT OF ONGOING PROBLEM(S):  DM:pt's DM remains stable. Staff deny polyuria, polydipsia, polyphagia, changes in vision or hypoglycemic episodes.  No complications noted from the medication presently being used.  Last hemoglobin A1c is: 9.3 in 3/14, in 6/14 hemoglobin A1c 8.6, in 9-14 hemoglobin A1c 8.1, and 12/14 hemoglobin A1c 7.4, in 3-15 hemoglobin A1c 7.4, in 6-15 hemoglobin A1c 7. Patient is a poor historian  HTN: Pt 's HTN remains stable.  Staff Deny CP, sob, DOE, pedal edema, headaches, dizziness or visual disturbances.  No complications from the medications currently being used.  Last BP : 116/66, 114/84, 136/76,116/64, 136/78, 106/64, 112/66, 100/52, 102/60, 136/78, 128/72, 128/76, 123/80, 116/58, 122/64, 114/63  BPH: The patient's BPH remains stable. Staff deny urinary hesitancy or dribbling. No complications reported from the current medications being used.  PAST MEDICAL HISTORY : Reviewed.  No changes.  [facility]  REVIEW OF SYSTEMS: Unobtainable secondary to patient being a poor historian  PHYSICAL EXAMINATION  VS:  See vital signs section  GENERAL: no acute distress, normal body habitus NECK: supple, trachea midline, no neck masses, no thyroid tenderness, no thyromegaly RESPIRATORY: breathing is even & unlabored, BS CTAB CARDIAC: RRR, no murmur,no extra heart sounds, no edema GI: abdomen soft, normal BS, no masses, no tenderness, no hepatomegaly, no splenomegaly PSYCHIATRIC: the patient is alert & oriented to person, affect & behavior appropriate  LABS/RADIOLOGY: 3-15 hemoglobin 11.3, MCV 86 otherwise CBC normal, glucose 201, BUN 45, creatinine 1.71 otherwise CMP normal, fasting lipid panel normal, magnesium 2.1 9-14 hemoglobin 10.8, MCV  85 otherwise CBC normal, glucose 140, BUN 43, creatinine 1.77 otherwise CMP normal, HDL 39 otherwise fasting lipid panel normal, magnesium 1.9 3/14 hemoglobin 11.6,  MCV 87 otherwise CBC normal, glucose 198, BUN 36, creatinine 1.81 otherwise CMP normal, fasting lipid panel normal, magnesium 1.8  ASSESSMENT/PLAN:  diabetes mellitus-adequately controlled renovascular hypertension-well-controlled. Hyperlipidemia-well-controlled. hypomagnesemia-well repleted. BPH-denies symptoms. chronic kidney disease-stable. Hypokalemia-continue supplementation GERD-continue protonix Depression- continue Lexapro  CPT CODE: 4098199308  Newton PiggGayani Y. Kerry Doryasanayaka, MD [facility] [phone]

## 2013-10-20 DIAGNOSIS — D649 Anemia, unspecified: Secondary | ICD-10-CM | POA: Diagnosis not present

## 2013-10-20 DIAGNOSIS — Z79899 Other long term (current) drug therapy: Secondary | ICD-10-CM | POA: Diagnosis not present

## 2013-10-20 DIAGNOSIS — R109 Unspecified abdominal pain: Secondary | ICD-10-CM | POA: Diagnosis not present

## 2013-10-20 DIAGNOSIS — R112 Nausea with vomiting, unspecified: Secondary | ICD-10-CM | POA: Diagnosis not present

## 2013-10-21 DIAGNOSIS — N390 Urinary tract infection, site not specified: Secondary | ICD-10-CM | POA: Diagnosis not present

## 2013-10-22 ENCOUNTER — Non-Acute Institutional Stay (SKILLED_NURSING_FACILITY): Payer: Medicare Other | Admitting: Internal Medicine

## 2013-10-22 DIAGNOSIS — D631 Anemia in chronic kidney disease: Secondary | ICD-10-CM | POA: Diagnosis not present

## 2013-10-22 DIAGNOSIS — D7289 Other specified disorders of white blood cells: Secondary | ICD-10-CM | POA: Diagnosis not present

## 2013-10-22 DIAGNOSIS — N189 Chronic kidney disease, unspecified: Secondary | ICD-10-CM

## 2013-10-22 DIAGNOSIS — N039 Chronic nephritic syndrome with unspecified morphologic changes: Secondary | ICD-10-CM

## 2013-10-26 ENCOUNTER — Non-Acute Institutional Stay (SKILLED_NURSING_FACILITY): Payer: Medicare Other | Admitting: Internal Medicine

## 2013-10-26 DIAGNOSIS — N390 Urinary tract infection, site not specified: Secondary | ICD-10-CM

## 2013-10-27 DIAGNOSIS — F329 Major depressive disorder, single episode, unspecified: Secondary | ICD-10-CM | POA: Diagnosis not present

## 2013-10-27 DIAGNOSIS — F3289 Other specified depressive episodes: Secondary | ICD-10-CM | POA: Diagnosis not present

## 2013-10-27 NOTE — Progress Notes (Signed)
Patient ID: Louis English, male   DOB: April 09, 1955, 58 y.o.   MRN: [id number]           PROGRESS NOTE  DATE: 10/22/2013        [facility]  LEVEL OF CARE: SNF (31)  Acute Visit  CHIEF COMPLAINT:  Manage chronic kidney disease and anemia of chronic kidney disease.    HISTORY OF PRESENT ILLNESS: I was requested by the staff to assess the patient regarding above problem(s):  CHRONIC KIDNEY DISEASE: The patient's chronic kidney disease remains stable.  Patient denies increasing lower extremity swelling or confusion. Last BUN and creatinine are:   On 10/20/2013:  BUN 28, creatinine 1.63.  In 05/2013:  BUN 45, creatinine 1.71.    ANEMIA: The anemia has been stable. The patient denies fatigue, melena or hematochezia. No complications from the medications currently being used.    On 10/20/2013:  Hemoglobin 10.7, MCV 86.  In 05/2013:  Hemoglobin 11.3.     PAST MEDICAL HISTORY : Reviewed.  No changes/see problem list  CURRENT MEDICATIONS: Reviewed per MAR/see medication list  REVIEW OF SYSTEMS:  GENERAL: no change in appetite, no fatigue, no weight changes, no fever, chills or weakness RESPIRATORY: no cough, SOB, DOE,, wheezing, hemoptysis CARDIAC: no chest pain, edema or palpitations GI: no abdominal pain, diarrhea, constipation, heart burn, nausea or vomiting  PHYSICAL EXAMINATION  VS: see VS section  GENERAL: no acute distress, normal body habitus NECK: supple, trachea midline, no neck masses, no thyroid tenderness, no thyromegaly RESPIRATORY: breathing is even & unlabored, BS CTAB CARDIAC: RRR, no murmur,no extra heart sounds, no edema GI: abdomen soft, normal BS, no masses, no tenderness, no hepatomegaly, no splenomegaly PSYCHIATRIC: the patient is alert & oriented to person, affect & behavior appropriate  LABS/RADIOLOGY:  On 10/20/2013:  WBC 11.4.    In 05/2013:  WBC 4.5.     ASSESSMENT/PLAN:  Chronic kidney disease.  Renal functions  improved.    Anemia of chronic kidney disease.  Hemoglobin declined.  We will monitor.     Leukocytosis.  New problem.  Patient is asymptomatic.  We will monitor.      CPT CODE: 1191499308         Angela CoxGayani Y Noreen Mackintosh, MD [facility] [phone]

## 2013-10-29 DIAGNOSIS — E1159 Type 2 diabetes mellitus with other circulatory complications: Secondary | ICD-10-CM | POA: Diagnosis not present

## 2013-10-29 NOTE — Progress Notes (Signed)
Patient ID: Louis English, male   DOB: 1955/07/31, 58 y.o.   MRN: [id number]           PROGRESS NOTE  DATE: 10/26/2013         [facility]  LEVEL OF CARE: SNF (31)  Acute Visit  CHIEF COMPLAINT:  Manage UTI.    HISTORY OF PRESENT ILLNESS: I was requested by the staff to assess the patient regarding above problem(s):  On 10/21/2013:  Patient's urine culture grows E.coli significantly.  Patient overall is a poor historian.    PAST MEDICAL HISTORY : Reviewed.  No changes/see problem list  CURRENT MEDICATIONS: Reviewed per MAR/see medication list  REVIEW OF SYSTEMS:  Difficult to obtain.  Patient is a poor historian.    PHYSICAL EXAMINATION  VS: see VS section  GENERAL: no acute distress, normal body habitus NECK: supple, trachea midline, no neck masses, no thyroid tenderness, no thyromegaly RESPIRATORY: breathing is even & unlabored, BS CTAB CARDIAC: RRR, no murmur,no extra heart sounds, no edema GI: abdomen soft, normal BS, no masses, no tenderness, no hepatomegaly, no splenomegaly PSYCHIATRIC: the patient is alert & oriented to person, affect & behavior appropriate  ASSESSMENT/PLAN:  UTI.  New problem.  Nitrofurantoin 100 mg b.i.d. for seven days and probiotics b.i.d. for 10 days started.    CPT CODE: [id number]           Louis CoxGayani Y Dasanayaka, MD [facility] [phone]

## 2013-11-12 DIAGNOSIS — S88119A Complete traumatic amputation at level between knee and ankle, unspecified lower leg, initial encounter: Secondary | ICD-10-CM | POA: Diagnosis not present

## 2013-11-12 DIAGNOSIS — R1310 Dysphagia, unspecified: Secondary | ICD-10-CM | POA: Diagnosis not present

## 2013-11-12 DIAGNOSIS — R6889 Other general symptoms and signs: Secondary | ICD-10-CM | POA: Diagnosis not present

## 2013-11-13 DIAGNOSIS — S88119A Complete traumatic amputation at level between knee and ankle, unspecified lower leg, initial encounter: Secondary | ICD-10-CM | POA: Diagnosis not present

## 2013-11-13 DIAGNOSIS — R6889 Other general symptoms and signs: Secondary | ICD-10-CM | POA: Diagnosis not present

## 2013-11-13 DIAGNOSIS — R1310 Dysphagia, unspecified: Secondary | ICD-10-CM | POA: Diagnosis not present

## 2013-11-16 DIAGNOSIS — S88119A Complete traumatic amputation at level between knee and ankle, unspecified lower leg, initial encounter: Secondary | ICD-10-CM | POA: Diagnosis not present

## 2013-11-16 DIAGNOSIS — R1310 Dysphagia, unspecified: Secondary | ICD-10-CM | POA: Diagnosis not present

## 2013-11-16 DIAGNOSIS — R6889 Other general symptoms and signs: Secondary | ICD-10-CM | POA: Diagnosis not present

## 2013-11-17 DIAGNOSIS — R6889 Other general symptoms and signs: Secondary | ICD-10-CM | POA: Diagnosis not present

## 2013-11-17 DIAGNOSIS — R1310 Dysphagia, unspecified: Secondary | ICD-10-CM | POA: Diagnosis not present

## 2013-11-17 DIAGNOSIS — S88119A Complete traumatic amputation at level between knee and ankle, unspecified lower leg, initial encounter: Secondary | ICD-10-CM | POA: Diagnosis not present

## 2013-11-18 DIAGNOSIS — R6889 Other general symptoms and signs: Secondary | ICD-10-CM | POA: Diagnosis not present

## 2013-11-18 DIAGNOSIS — Z79899 Other long term (current) drug therapy: Secondary | ICD-10-CM | POA: Diagnosis not present

## 2013-11-18 DIAGNOSIS — E119 Type 2 diabetes mellitus without complications: Secondary | ICD-10-CM | POA: Diagnosis not present

## 2013-11-18 DIAGNOSIS — E7800 Pure hypercholesterolemia, unspecified: Secondary | ICD-10-CM | POA: Diagnosis not present

## 2013-11-18 DIAGNOSIS — R1310 Dysphagia, unspecified: Secondary | ICD-10-CM | POA: Diagnosis not present

## 2013-11-18 DIAGNOSIS — S88119A Complete traumatic amputation at level between knee and ankle, unspecified lower leg, initial encounter: Secondary | ICD-10-CM | POA: Diagnosis not present

## 2013-11-18 DIAGNOSIS — D649 Anemia, unspecified: Secondary | ICD-10-CM | POA: Diagnosis not present

## 2013-11-19 DIAGNOSIS — R1310 Dysphagia, unspecified: Secondary | ICD-10-CM | POA: Diagnosis not present

## 2013-11-19 DIAGNOSIS — S88119A Complete traumatic amputation at level between knee and ankle, unspecified lower leg, initial encounter: Secondary | ICD-10-CM | POA: Diagnosis not present

## 2013-11-19 DIAGNOSIS — R6889 Other general symptoms and signs: Secondary | ICD-10-CM | POA: Diagnosis not present

## 2013-11-20 DIAGNOSIS — S88119A Complete traumatic amputation at level between knee and ankle, unspecified lower leg, initial encounter: Secondary | ICD-10-CM | POA: Diagnosis not present

## 2013-11-20 DIAGNOSIS — R1310 Dysphagia, unspecified: Secondary | ICD-10-CM | POA: Diagnosis not present

## 2013-11-20 DIAGNOSIS — R6889 Other general symptoms and signs: Secondary | ICD-10-CM | POA: Diagnosis not present

## 2013-11-23 ENCOUNTER — Non-Acute Institutional Stay (SKILLED_NURSING_FACILITY): Payer: Medicare Other | Admitting: Internal Medicine

## 2013-11-23 DIAGNOSIS — E1029 Type 1 diabetes mellitus with other diabetic kidney complication: Secondary | ICD-10-CM | POA: Diagnosis not present

## 2013-11-23 DIAGNOSIS — N189 Chronic kidney disease, unspecified: Secondary | ICD-10-CM

## 2013-11-23 DIAGNOSIS — N039 Chronic nephritic syndrome with unspecified morphologic changes: Secondary | ICD-10-CM

## 2013-11-23 DIAGNOSIS — D631 Anemia in chronic kidney disease: Secondary | ICD-10-CM | POA: Diagnosis not present

## 2013-11-23 DIAGNOSIS — E1065 Type 1 diabetes mellitus with hyperglycemia: Principal | ICD-10-CM

## 2013-11-25 ENCOUNTER — Non-Acute Institutional Stay (SKILLED_NURSING_FACILITY): Payer: Medicare Other | Admitting: Internal Medicine

## 2013-11-25 DIAGNOSIS — I150 Renovascular hypertension: Secondary | ICD-10-CM

## 2013-11-25 DIAGNOSIS — E1129 Type 2 diabetes mellitus with other diabetic kidney complication: Secondary | ICD-10-CM | POA: Diagnosis not present

## 2013-11-25 DIAGNOSIS — N400 Enlarged prostate without lower urinary tract symptoms: Secondary | ICD-10-CM | POA: Diagnosis not present

## 2013-11-25 DIAGNOSIS — E1165 Type 2 diabetes mellitus with hyperglycemia: Principal | ICD-10-CM

## 2013-11-25 DIAGNOSIS — E7800 Pure hypercholesterolemia, unspecified: Secondary | ICD-10-CM | POA: Diagnosis not present

## 2013-11-26 NOTE — Progress Notes (Signed)
         PROGRESS NOTE  DATE: 9- 16-15  FACILITY: [facility]  LEVEL OF CARE: SNF  Routine Visit  CHIEF COMPLAINT:  Manage diabetes mellitus, BPH and hypertension  HISTORY OF PRESENT ILLNESS:  REASSESSMENT OF ONGOING PROBLEM(S):  DM:pt's DM remains stable. Staff deny polyuria, polydipsia, polyphagia, changes in vision or hypoglycemic episodes.  No complications noted from the medication presently being used.  Last hemoglobin A1c is: 9.3 in 3/14, in 6/14 hemoglobin A1c 8.6, in 9-14 hemoglobin A1c 8.1, and 12/14 hemoglobin A1c 7.4, in 3-15 hemoglobin A1c 7.4, in 6-15 hemoglobin A1c 7, in 9-15 hemoglobin A1c 7.3. Patient is a poor historian  HTN: Pt 's HTN remains stable.  Staff Deny CP, sob, DOE, pedal edema, headaches, dizziness or visual disturbances.  No complications from the medications currently being used.  Last BP : 116/66, 114/84, 136/76,116/64, 136/78, 106/64, 112/66, 100/52, 102/60, 136/78, 128/72, 128/76, 123/80, 116/58, 122/64, 114/63, 122/60  BPH: The patient's BPH remains stable. Staff deny urinary hesitancy or dribbling. No complications reported from the current medications being used.  PAST MEDICAL HISTORY : Reviewed.  No changes.  CURRENT MEDICATIONS: Reviewed per [facility]  REVIEW OF SYSTEMS: Unobtainable secondary to patient being a poor historian  PHYSICAL EXAMINATION  VS:  See vital signs section  GENERAL: no acute distress, normal body habitus EYES: Normal sclerae, normal conjunctivae, no discharge NECK: supple, trachea midline, no neck masses, no thyroid tenderness, no thyromegaly LYMPHATICS: No cervical lymphadenopathy, supraclavicular lymphadenopathy RESPIRATORY: breathing is even & unlabored, BS CTAB CARDIAC: RRR, no murmur,no extra heart sounds, no edema GI: abdomen soft, normal BS, no masses, no tenderness, no hepatomegaly, no splenomegaly PSYCHIATRIC: the patient is alert & oriented to person, affect & behavior appropriate  LABS/RADIOLOGY: 9-15  hemoglobin 10.6, MCV 86 otherwise CBC normal, glucose 151, BUN 29 creatinine 1.62 to otherwise CMP normal, FLP normal, magnesium 1.4 3-15 hemoglobin 11.3, MCV 86 otherwise CBC normal, glucose 201, BUN 45, creatinine 1.71 otherwise CMP normal, fasting lipid panel normal, magnesium 2.1 9-14 hemoglobin 10.8, MCV 85 otherwise CBC normal, glucose 140, BUN 43, creatinine 1.77 otherwise CMP normal, HDL 39 otherwise fasting lipid panel normal, magnesium 1.9 3/14 hemoglobin 11.6,  MCV 87 otherwise CBC normal, glucose 198, BUN 36, creatinine 1.81 otherwise CMP normal, fasting lipid panel normal, magnesium 1.8  ASSESSMENT/PLAN:  diabetes mellitus-uncontrolled. Levemir was increased. renovascular hypertension-well-controlled. Hyperlipidemia-well-controlled. hypomagnesemia-uncontrolled problem. Magnesium oxide was increased. Recheck pending. BPH-denies symptoms. chronic kidney disease-stable. Hypokalemia-continue supplementation GERD-continue protonix Depression- continue Lexapro  CPT CODE: 16109  Newton Pigg. Kerry Dory, MD [facility] [phone]

## 2013-11-27 NOTE — Progress Notes (Signed)
Patient ID: Louis English, male   DOB: Jan 08, 1956, 58 y.o.   MRN: [id number]           PROGRESS NOTE  DATE: 11/23/2013         [facility]  LEVEL OF CARE: SNF (31)  Acute Visit  CHIEF COMPLAINT:  Manage diabetes mellitus, hypomagnesemia, and chronic kidney disease.    HISTORY OF PRESENT ILLNESS: I was requested by the staff to assess the patient regarding above problem(s):  DM:pt's DM is unstable.  Staff denies polyuria, polydipsia, polyphagia, changes in vision or hypoglycemic episodes.  No complications noted from the medication presently being used.  Last hemoglobin A1c is:  On 11/18/2013:   Hemoglobin A1c 7.3.  Patient is a poor historian.    CHRONIC KIDNEY DISEASE: The patient's chronic kidney disease remains stable.  Staff denies increasing lower extremity swelling or confusion. Last BUN and creatinine are:   On 11/18/2013:  BUN 29, creatinine 1.62.  In 10/2013:  BUN 28, creatinine 1.63.    HYPOMAGNESEMIA:  Unstable problem.  On 11/18/2013:   Magnesium 1.4.  Patient is on magnesium supplementation and tolerates it without any problems.  Staff do not report any neuromuscular problems.    PAST MEDICAL HISTORY : Reviewed.  No changes/see problem list  CURRENT MEDICATIONS: Reviewed per MAR/see medication list  REVIEW OF SYSTEMS:  Unobtainable.  Patient is a poor historian.    PHYSICAL EXAMINATION  VS: see VS section  GENERAL: no acute distress, normal body habitus EYES: conjunctivae normal, sclerae normal, normal eye lids NECK: supple, trachea midline, no neck masses, no thyroid tenderness, no thyromegaly LYMPHATICS: no LAN in the neck, no supraclavicular LAN RESPIRATORY: breathing is even & unlabored, BS CTAB CARDIAC: RRR, no murmur,no extra heart sounds, no edema GI: abdomen soft, normal BS, no masses, no tenderness, no hepatomegaly, no splenomegaly PSYCHIATRIC: the patient is alert, unable to assess orientation, affect & mood  appropriate  LABS/RADIOLOGY:  On 11/18/2013:  Hemoglobin 10.6, MCV 86.    On 10/20/2013:  Hemoglobin 10.7.    ASSESSMENT/PLAN:  Diabetes mellitus with renal complications.  Uncontrolled problem.  Increase Levemir to 12 U q.a.m. and 6 U q.h.s.    Hypomagnesemia.  Unstable problem.  Increase magnesium oxide to 400 mg  t.i.d.  Check magnesium level in two weeks.    Chronic kidney disease.  Stable.    Anemia of chronic kidney disease.  Hemoglobin stable.     CPT CODE: 11914           Angela Cox, MD [facility] [phone]

## 2013-12-09 DIAGNOSIS — Z79899 Other long term (current) drug therapy: Secondary | ICD-10-CM | POA: Diagnosis not present

## 2013-12-16 ENCOUNTER — Non-Acute Institutional Stay (SKILLED_NURSING_FACILITY): Payer: Medicare Other | Admitting: Internal Medicine

## 2013-12-16 DIAGNOSIS — N189 Chronic kidney disease, unspecified: Secondary | ICD-10-CM

## 2013-12-16 DIAGNOSIS — E7800 Pure hypercholesterolemia, unspecified: Secondary | ICD-10-CM

## 2013-12-16 DIAGNOSIS — N400 Enlarged prostate without lower urinary tract symptoms: Secondary | ICD-10-CM | POA: Diagnosis not present

## 2013-12-16 DIAGNOSIS — E1122 Type 2 diabetes mellitus with diabetic chronic kidney disease: Secondary | ICD-10-CM

## 2013-12-16 DIAGNOSIS — I150 Renovascular hypertension: Secondary | ICD-10-CM

## 2013-12-17 DIAGNOSIS — Z79899 Other long term (current) drug therapy: Secondary | ICD-10-CM | POA: Diagnosis not present

## 2013-12-19 NOTE — Progress Notes (Signed)
         PROGRESS NOTE  DATE: 10- 7-15  FACILITY: [facility]  LEVEL OF CARE: SNF  Routine Visit  CHIEF COMPLAINT:  Manage diabetes mellitus, BPH and hypertension  HISTORY OF PRESENT ILLNESS:  REASSESSMENT OF ONGOING PROBLEM(S):  DM:pt's DM remains stable. Staff deny polyuria, polydipsia, polyphagia, changes in vision or hypoglycemic episodes.  No complications noted from the medication presently being used.  Last hemoglobin A1c is: 9.3 in 3/14, in 6/14 hemoglobin A1c 8.6, in 9-14 hemoglobin A1c 8.1, and 12/14 hemoglobin A1c 7.4, in 3-15 hemoglobin A1c 7.4, in 6-15 hemoglobin A1c 7, in 9-15 hemoglobin A1c 7.3. Patient is a poor historian  HTN: Pt 's HTN remains stable.  Staff Deny CP, sob, DOE, pedal edema, headaches, dizziness or visual disturbances.  No complications from the medications currently being used.  Last BP : 116/66, 114/84, 136/76,116/64, 136/78, 106/64, 112/66, 100/52, 102/60, 136/78, 128/72, 128/76, 123/80, 116/58, 122/64, 114/63, 122/60, 104/ 68  BPH: The patient's BPH remains stable. Staff deny urinary hesitancy or dribbling. No complications reported from the current medications being used.  PAST MEDICAL HISTORY : Reviewed.  No changes.  CURRENT MEDICATIONS: [facility]  REVIEW OF SYSTEMS: Unobtainable secondary to patient being a poor historian  PHYSICAL EXAMINATION  VS:  See vital signs section  GENERAL: no acute distress, normal body habitus NECK: supple, trachea midline, no neck masses, no thyroid tenderness, no thyromegaly RESPIRATORY: breathing is even & unlabored, BS CTAB CARDIAC: RRR, no murmur,no extra heart sounds, no edema GI: abdomen soft, normal BS, no masses, no tenderness, no hepatomegaly, no splenomegaly PSYCHIATRIC: the patient is alert & oriented to person, affect & behavior appropriate  LABS/RADIOLOGY: 9-15 hemoglobin 10.6, MCV 86 otherwise CBC normal, glucose 151, BUN 29 creatinine 1.62 to otherwise CMP normal, FLP normal, magnesium  1.4 3-15 hemoglobin 11.3, MCV 86 otherwise CBC normal, glucose 201, BUN 45, creatinine 1.71 otherwise CMP normal, fasting lipid panel normal, magnesium 2.1 9-14 hemoglobin 10.8, MCV 85 otherwise CBC normal, glucose 140, BUN 43, creatinine 1.77 otherwise CMP normal, HDL 39 otherwise fasting lipid panel normal, magnesium 1.9 3/14 hemoglobin 11.6,  MCV 87 otherwise CBC normal, glucose 198, BUN 36, creatinine 1.81 otherwise CMP normal, fasting lipid panel normal, magnesium 1.8  ASSESSMENT/PLAN:  diabetes mellitus-uncontrolled. Levemir was increased. renovascular hypertension-well-controlled. Hyperlipidemia-well-controlled. hypomagnesemia-uncontrolled problem. Magnesium oxide was increased. Recheck pending. BPH-denies symptoms. chronic kidney disease-stable. Hypokalemia-continue supplementation GERD-continue protonix Depression- continue Lexapro  CPT CODE: [id number]  Louis PiggGayani Y. Kerry Doryasanayaka, MD [facility] [phone]

## 2013-12-25 DIAGNOSIS — Z89519 Acquired absence of unspecified leg below knee: Secondary | ICD-10-CM | POA: Diagnosis not present

## 2013-12-25 DIAGNOSIS — R1310 Dysphagia, unspecified: Secondary | ICD-10-CM | POA: Diagnosis not present

## 2013-12-25 DIAGNOSIS — R1311 Dysphagia, oral phase: Secondary | ICD-10-CM | POA: Diagnosis not present

## 2013-12-27 DIAGNOSIS — R1310 Dysphagia, unspecified: Secondary | ICD-10-CM | POA: Diagnosis not present

## 2013-12-27 DIAGNOSIS — Z89519 Acquired absence of unspecified leg below knee: Secondary | ICD-10-CM | POA: Diagnosis not present

## 2013-12-27 DIAGNOSIS — R1311 Dysphagia, oral phase: Secondary | ICD-10-CM | POA: Diagnosis not present

## 2013-12-28 DIAGNOSIS — R1310 Dysphagia, unspecified: Secondary | ICD-10-CM | POA: Diagnosis not present

## 2013-12-28 DIAGNOSIS — Z89519 Acquired absence of unspecified leg below knee: Secondary | ICD-10-CM | POA: Diagnosis not present

## 2013-12-28 DIAGNOSIS — R1311 Dysphagia, oral phase: Secondary | ICD-10-CM | POA: Diagnosis not present

## 2013-12-29 DIAGNOSIS — R1310 Dysphagia, unspecified: Secondary | ICD-10-CM | POA: Diagnosis not present

## 2013-12-29 DIAGNOSIS — R1311 Dysphagia, oral phase: Secondary | ICD-10-CM | POA: Diagnosis not present

## 2013-12-29 DIAGNOSIS — Z89519 Acquired absence of unspecified leg below knee: Secondary | ICD-10-CM | POA: Diagnosis not present

## 2013-12-30 DIAGNOSIS — Z89519 Acquired absence of unspecified leg below knee: Secondary | ICD-10-CM | POA: Diagnosis not present

## 2013-12-30 DIAGNOSIS — R1311 Dysphagia, oral phase: Secondary | ICD-10-CM | POA: Diagnosis not present

## 2013-12-30 DIAGNOSIS — R1310 Dysphagia, unspecified: Secondary | ICD-10-CM | POA: Diagnosis not present

## 2014-01-01 DIAGNOSIS — R1311 Dysphagia, oral phase: Secondary | ICD-10-CM | POA: Diagnosis not present

## 2014-01-01 DIAGNOSIS — R1310 Dysphagia, unspecified: Secondary | ICD-10-CM | POA: Diagnosis not present

## 2014-01-01 DIAGNOSIS — Z89519 Acquired absence of unspecified leg below knee: Secondary | ICD-10-CM | POA: Diagnosis not present

## 2014-01-04 DIAGNOSIS — R1311 Dysphagia, oral phase: Secondary | ICD-10-CM | POA: Diagnosis not present

## 2014-01-04 DIAGNOSIS — R1310 Dysphagia, unspecified: Secondary | ICD-10-CM | POA: Diagnosis not present

## 2014-01-04 DIAGNOSIS — Z89519 Acquired absence of unspecified leg below knee: Secondary | ICD-10-CM | POA: Diagnosis not present

## 2014-01-05 DIAGNOSIS — R1310 Dysphagia, unspecified: Secondary | ICD-10-CM | POA: Diagnosis not present

## 2014-01-05 DIAGNOSIS — R1311 Dysphagia, oral phase: Secondary | ICD-10-CM | POA: Diagnosis not present

## 2014-01-05 DIAGNOSIS — Z89519 Acquired absence of unspecified leg below knee: Secondary | ICD-10-CM | POA: Diagnosis not present

## 2014-01-06 DIAGNOSIS — Z89519 Acquired absence of unspecified leg below knee: Secondary | ICD-10-CM | POA: Diagnosis not present

## 2014-01-06 DIAGNOSIS — R1310 Dysphagia, unspecified: Secondary | ICD-10-CM | POA: Diagnosis not present

## 2014-01-06 DIAGNOSIS — R1311 Dysphagia, oral phase: Secondary | ICD-10-CM | POA: Diagnosis not present

## 2014-01-07 DIAGNOSIS — Z89519 Acquired absence of unspecified leg below knee: Secondary | ICD-10-CM | POA: Diagnosis not present

## 2014-01-07 DIAGNOSIS — R1310 Dysphagia, unspecified: Secondary | ICD-10-CM | POA: Diagnosis not present

## 2014-01-07 DIAGNOSIS — R1311 Dysphagia, oral phase: Secondary | ICD-10-CM | POA: Diagnosis not present

## 2014-01-08 DIAGNOSIS — R1310 Dysphagia, unspecified: Secondary | ICD-10-CM | POA: Diagnosis not present

## 2014-01-08 DIAGNOSIS — Z89519 Acquired absence of unspecified leg below knee: Secondary | ICD-10-CM | POA: Diagnosis not present

## 2014-01-08 DIAGNOSIS — R1311 Dysphagia, oral phase: Secondary | ICD-10-CM | POA: Diagnosis not present

## 2014-01-11 DIAGNOSIS — R1311 Dysphagia, oral phase: Secondary | ICD-10-CM | POA: Diagnosis not present

## 2014-01-11 DIAGNOSIS — R1310 Dysphagia, unspecified: Secondary | ICD-10-CM | POA: Diagnosis not present

## 2014-01-11 DIAGNOSIS — Z89519 Acquired absence of unspecified leg below knee: Secondary | ICD-10-CM | POA: Diagnosis not present

## 2014-01-12 DIAGNOSIS — Z89519 Acquired absence of unspecified leg below knee: Secondary | ICD-10-CM | POA: Diagnosis not present

## 2014-01-12 DIAGNOSIS — R1310 Dysphagia, unspecified: Secondary | ICD-10-CM | POA: Diagnosis not present

## 2014-01-12 DIAGNOSIS — F329 Major depressive disorder, single episode, unspecified: Secondary | ICD-10-CM | POA: Diagnosis not present

## 2014-01-12 DIAGNOSIS — R1311 Dysphagia, oral phase: Secondary | ICD-10-CM | POA: Diagnosis not present

## 2014-01-13 DIAGNOSIS — R1310 Dysphagia, unspecified: Secondary | ICD-10-CM | POA: Diagnosis not present

## 2014-01-13 DIAGNOSIS — R1311 Dysphagia, oral phase: Secondary | ICD-10-CM | POA: Diagnosis not present

## 2014-01-13 DIAGNOSIS — Z23 Encounter for immunization: Secondary | ICD-10-CM | POA: Diagnosis not present

## 2014-01-13 DIAGNOSIS — Z89519 Acquired absence of unspecified leg below knee: Secondary | ICD-10-CM | POA: Diagnosis not present

## 2014-01-14 DIAGNOSIS — I739 Peripheral vascular disease, unspecified: Secondary | ICD-10-CM | POA: Diagnosis not present

## 2014-01-14 DIAGNOSIS — R1311 Dysphagia, oral phase: Secondary | ICD-10-CM | POA: Diagnosis not present

## 2014-01-14 DIAGNOSIS — I10 Essential (primary) hypertension: Secondary | ICD-10-CM | POA: Diagnosis not present

## 2014-01-14 DIAGNOSIS — R1310 Dysphagia, unspecified: Secondary | ICD-10-CM | POA: Diagnosis not present

## 2014-01-14 DIAGNOSIS — Z89519 Acquired absence of unspecified leg below knee: Secondary | ICD-10-CM | POA: Diagnosis not present

## 2014-01-14 DIAGNOSIS — K210 Gastro-esophageal reflux disease with esophagitis: Secondary | ICD-10-CM | POA: Diagnosis not present

## 2014-01-14 DIAGNOSIS — N183 Chronic kidney disease, stage 3 (moderate): Secondary | ICD-10-CM | POA: Diagnosis not present

## 2014-01-14 DIAGNOSIS — E0822 Diabetes mellitus due to underlying condition with diabetic chronic kidney disease: Secondary | ICD-10-CM | POA: Diagnosis not present

## 2014-01-14 DIAGNOSIS — E440 Moderate protein-calorie malnutrition: Secondary | ICD-10-CM | POA: Diagnosis not present

## 2014-01-14 DIAGNOSIS — F0390 Unspecified dementia without behavioral disturbance: Secondary | ICD-10-CM | POA: Diagnosis not present

## 2014-01-14 DIAGNOSIS — H540 Blindness, both eyes: Secondary | ICD-10-CM | POA: Diagnosis not present

## 2014-01-15 DIAGNOSIS — R1311 Dysphagia, oral phase: Secondary | ICD-10-CM | POA: Diagnosis not present

## 2014-01-15 DIAGNOSIS — R1310 Dysphagia, unspecified: Secondary | ICD-10-CM | POA: Diagnosis not present

## 2014-01-15 DIAGNOSIS — Z89519 Acquired absence of unspecified leg below knee: Secondary | ICD-10-CM | POA: Diagnosis not present

## 2014-01-18 DIAGNOSIS — R1311 Dysphagia, oral phase: Secondary | ICD-10-CM | POA: Diagnosis not present

## 2014-01-18 DIAGNOSIS — Z89519 Acquired absence of unspecified leg below knee: Secondary | ICD-10-CM | POA: Diagnosis not present

## 2014-01-18 DIAGNOSIS — R1310 Dysphagia, unspecified: Secondary | ICD-10-CM | POA: Diagnosis not present

## 2014-01-19 DIAGNOSIS — R1310 Dysphagia, unspecified: Secondary | ICD-10-CM | POA: Diagnosis not present

## 2014-01-19 DIAGNOSIS — Z89519 Acquired absence of unspecified leg below knee: Secondary | ICD-10-CM | POA: Diagnosis not present

## 2014-01-19 DIAGNOSIS — R1311 Dysphagia, oral phase: Secondary | ICD-10-CM | POA: Diagnosis not present

## 2014-01-20 DIAGNOSIS — R1310 Dysphagia, unspecified: Secondary | ICD-10-CM | POA: Diagnosis not present

## 2014-01-20 DIAGNOSIS — Z89519 Acquired absence of unspecified leg below knee: Secondary | ICD-10-CM | POA: Diagnosis not present

## 2014-01-20 DIAGNOSIS — R1311 Dysphagia, oral phase: Secondary | ICD-10-CM | POA: Diagnosis not present

## 2014-01-21 DIAGNOSIS — R1310 Dysphagia, unspecified: Secondary | ICD-10-CM | POA: Diagnosis not present

## 2014-01-21 DIAGNOSIS — R1311 Dysphagia, oral phase: Secondary | ICD-10-CM | POA: Diagnosis not present

## 2014-01-21 DIAGNOSIS — Z89519 Acquired absence of unspecified leg below knee: Secondary | ICD-10-CM | POA: Diagnosis not present

## 2014-01-22 DIAGNOSIS — R1310 Dysphagia, unspecified: Secondary | ICD-10-CM | POA: Diagnosis not present

## 2014-01-22 DIAGNOSIS — Z89519 Acquired absence of unspecified leg below knee: Secondary | ICD-10-CM | POA: Diagnosis not present

## 2014-01-22 DIAGNOSIS — R1311 Dysphagia, oral phase: Secondary | ICD-10-CM | POA: Diagnosis not present

## 2014-01-25 DIAGNOSIS — Z89519 Acquired absence of unspecified leg below knee: Secondary | ICD-10-CM | POA: Diagnosis not present

## 2014-01-25 DIAGNOSIS — R1311 Dysphagia, oral phase: Secondary | ICD-10-CM | POA: Diagnosis not present

## 2014-01-25 DIAGNOSIS — R1310 Dysphagia, unspecified: Secondary | ICD-10-CM | POA: Diagnosis not present

## 2014-01-28 DIAGNOSIS — E1051 Type 1 diabetes mellitus with diabetic peripheral angiopathy without gangrene: Secondary | ICD-10-CM | POA: Diagnosis not present

## 2014-02-11 DIAGNOSIS — E119 Type 2 diabetes mellitus without complications: Secondary | ICD-10-CM | POA: Diagnosis not present

## 2014-03-18 DIAGNOSIS — N183 Chronic kidney disease, stage 3 (moderate): Secondary | ICD-10-CM | POA: Diagnosis not present

## 2014-03-18 DIAGNOSIS — I739 Peripheral vascular disease, unspecified: Secondary | ICD-10-CM | POA: Diagnosis not present

## 2014-03-18 DIAGNOSIS — F324 Major depressive disorder, single episode, in partial remission: Secondary | ICD-10-CM | POA: Diagnosis not present

## 2014-03-18 DIAGNOSIS — H540 Blindness, both eyes: Secondary | ICD-10-CM | POA: Diagnosis not present

## 2014-03-18 DIAGNOSIS — N400 Enlarged prostate without lower urinary tract symptoms: Secondary | ICD-10-CM | POA: Diagnosis not present

## 2014-03-23 DIAGNOSIS — F329 Major depressive disorder, single episode, unspecified: Secondary | ICD-10-CM | POA: Diagnosis not present

## 2014-03-23 DIAGNOSIS — G4700 Insomnia, unspecified: Secondary | ICD-10-CM | POA: Diagnosis not present

## 2014-03-31 DIAGNOSIS — R05 Cough: Secondary | ICD-10-CM | POA: Diagnosis not present

## 2014-03-31 DIAGNOSIS — Z5189 Encounter for other specified aftercare: Secondary | ICD-10-CM | POA: Diagnosis not present

## 2014-04-15 DIAGNOSIS — E119 Type 2 diabetes mellitus without complications: Secondary | ICD-10-CM | POA: Diagnosis not present

## 2014-04-19 DIAGNOSIS — E11359 Type 2 diabetes mellitus with proliferative diabetic retinopathy without macular edema: Secondary | ICD-10-CM | POA: Diagnosis not present

## 2014-04-19 DIAGNOSIS — H2523 Age-related cataract, morgagnian type, bilateral: Secondary | ICD-10-CM | POA: Diagnosis not present

## 2014-05-13 DIAGNOSIS — E46 Unspecified protein-calorie malnutrition: Secondary | ICD-10-CM | POA: Diagnosis not present

## 2014-05-13 DIAGNOSIS — Z89519 Acquired absence of unspecified leg below knee: Secondary | ICD-10-CM | POA: Diagnosis not present

## 2014-05-13 DIAGNOSIS — R41841 Cognitive communication deficit: Secondary | ICD-10-CM | POA: Diagnosis not present

## 2014-05-13 DIAGNOSIS — E780 Pure hypercholesterolemia: Secondary | ICD-10-CM | POA: Diagnosis not present

## 2014-05-13 DIAGNOSIS — R1312 Dysphagia, oropharyngeal phase: Secondary | ICD-10-CM | POA: Diagnosis not present

## 2014-05-13 DIAGNOSIS — D649 Anemia, unspecified: Secondary | ICD-10-CM | POA: Diagnosis not present

## 2014-05-13 DIAGNOSIS — Z79899 Other long term (current) drug therapy: Secondary | ICD-10-CM | POA: Diagnosis not present

## 2014-05-14 DIAGNOSIS — E46 Unspecified protein-calorie malnutrition: Secondary | ICD-10-CM | POA: Diagnosis not present

## 2014-05-14 DIAGNOSIS — R1312 Dysphagia, oropharyngeal phase: Secondary | ICD-10-CM | POA: Diagnosis not present

## 2014-05-14 DIAGNOSIS — Z89519 Acquired absence of unspecified leg below knee: Secondary | ICD-10-CM | POA: Diagnosis not present

## 2014-05-14 DIAGNOSIS — R41841 Cognitive communication deficit: Secondary | ICD-10-CM | POA: Diagnosis not present

## 2014-05-17 DIAGNOSIS — R1312 Dysphagia, oropharyngeal phase: Secondary | ICD-10-CM | POA: Diagnosis not present

## 2014-05-17 DIAGNOSIS — E46 Unspecified protein-calorie malnutrition: Secondary | ICD-10-CM | POA: Diagnosis not present

## 2014-05-17 DIAGNOSIS — Z89519 Acquired absence of unspecified leg below knee: Secondary | ICD-10-CM | POA: Diagnosis not present

## 2014-05-17 DIAGNOSIS — R41841 Cognitive communication deficit: Secondary | ICD-10-CM | POA: Diagnosis not present

## 2014-05-18 DIAGNOSIS — Z89519 Acquired absence of unspecified leg below knee: Secondary | ICD-10-CM | POA: Diagnosis not present

## 2014-05-18 DIAGNOSIS — R41841 Cognitive communication deficit: Secondary | ICD-10-CM | POA: Diagnosis not present

## 2014-05-18 DIAGNOSIS — E46 Unspecified protein-calorie malnutrition: Secondary | ICD-10-CM | POA: Diagnosis not present

## 2014-05-18 DIAGNOSIS — R1312 Dysphagia, oropharyngeal phase: Secondary | ICD-10-CM | POA: Diagnosis not present

## 2014-05-19 DIAGNOSIS — E46 Unspecified protein-calorie malnutrition: Secondary | ICD-10-CM | POA: Diagnosis not present

## 2014-05-19 DIAGNOSIS — R1312 Dysphagia, oropharyngeal phase: Secondary | ICD-10-CM | POA: Diagnosis not present

## 2014-05-19 DIAGNOSIS — R41841 Cognitive communication deficit: Secondary | ICD-10-CM | POA: Diagnosis not present

## 2014-05-19 DIAGNOSIS — Z89519 Acquired absence of unspecified leg below knee: Secondary | ICD-10-CM | POA: Diagnosis not present

## 2014-05-20 DIAGNOSIS — I10 Essential (primary) hypertension: Secondary | ICD-10-CM | POA: Diagnosis not present

## 2014-05-20 DIAGNOSIS — E46 Unspecified protein-calorie malnutrition: Secondary | ICD-10-CM | POA: Diagnosis not present

## 2014-05-20 DIAGNOSIS — E1140 Type 2 diabetes mellitus with diabetic neuropathy, unspecified: Secondary | ICD-10-CM | POA: Diagnosis not present

## 2014-05-20 DIAGNOSIS — F322 Major depressive disorder, single episode, severe without psychotic features: Secondary | ICD-10-CM | POA: Diagnosis not present

## 2014-05-20 DIAGNOSIS — R41841 Cognitive communication deficit: Secondary | ICD-10-CM | POA: Diagnosis not present

## 2014-05-20 DIAGNOSIS — Z89519 Acquired absence of unspecified leg below knee: Secondary | ICD-10-CM | POA: Diagnosis not present

## 2014-05-20 DIAGNOSIS — R1312 Dysphagia, oropharyngeal phase: Secondary | ICD-10-CM | POA: Diagnosis not present

## 2014-05-20 DIAGNOSIS — N183 Chronic kidney disease, stage 3 (moderate): Secondary | ICD-10-CM | POA: Diagnosis not present

## 2014-05-20 DIAGNOSIS — Z89512 Acquired absence of left leg below knee: Secondary | ICD-10-CM | POA: Diagnosis not present

## 2014-05-20 DIAGNOSIS — I739 Peripheral vascular disease, unspecified: Secondary | ICD-10-CM | POA: Diagnosis not present

## 2014-05-21 DIAGNOSIS — E46 Unspecified protein-calorie malnutrition: Secondary | ICD-10-CM | POA: Diagnosis not present

## 2014-05-21 DIAGNOSIS — R41841 Cognitive communication deficit: Secondary | ICD-10-CM | POA: Diagnosis not present

## 2014-05-21 DIAGNOSIS — R1312 Dysphagia, oropharyngeal phase: Secondary | ICD-10-CM | POA: Diagnosis not present

## 2014-05-21 DIAGNOSIS — Z89519 Acquired absence of unspecified leg below knee: Secondary | ICD-10-CM | POA: Diagnosis not present

## 2014-05-23 DIAGNOSIS — R1312 Dysphagia, oropharyngeal phase: Secondary | ICD-10-CM | POA: Diagnosis not present

## 2014-05-23 DIAGNOSIS — Z89519 Acquired absence of unspecified leg below knee: Secondary | ICD-10-CM | POA: Diagnosis not present

## 2014-05-23 DIAGNOSIS — R41841 Cognitive communication deficit: Secondary | ICD-10-CM | POA: Diagnosis not present

## 2014-05-23 DIAGNOSIS — E46 Unspecified protein-calorie malnutrition: Secondary | ICD-10-CM | POA: Diagnosis not present

## 2014-05-24 DIAGNOSIS — R1312 Dysphagia, oropharyngeal phase: Secondary | ICD-10-CM | POA: Diagnosis not present

## 2014-05-24 DIAGNOSIS — Z89519 Acquired absence of unspecified leg below knee: Secondary | ICD-10-CM | POA: Diagnosis not present

## 2014-05-24 DIAGNOSIS — E46 Unspecified protein-calorie malnutrition: Secondary | ICD-10-CM | POA: Diagnosis not present

## 2014-05-24 DIAGNOSIS — R41841 Cognitive communication deficit: Secondary | ICD-10-CM | POA: Diagnosis not present

## 2014-05-25 DIAGNOSIS — F329 Major depressive disorder, single episode, unspecified: Secondary | ICD-10-CM | POA: Diagnosis not present

## 2014-05-25 DIAGNOSIS — Z89519 Acquired absence of unspecified leg below knee: Secondary | ICD-10-CM | POA: Diagnosis not present

## 2014-05-25 DIAGNOSIS — R1312 Dysphagia, oropharyngeal phase: Secondary | ICD-10-CM | POA: Diagnosis not present

## 2014-05-25 DIAGNOSIS — R41841 Cognitive communication deficit: Secondary | ICD-10-CM | POA: Diagnosis not present

## 2014-05-25 DIAGNOSIS — G4700 Insomnia, unspecified: Secondary | ICD-10-CM | POA: Diagnosis not present

## 2014-05-25 DIAGNOSIS — E46 Unspecified protein-calorie malnutrition: Secondary | ICD-10-CM | POA: Diagnosis not present

## 2014-05-26 DIAGNOSIS — R1312 Dysphagia, oropharyngeal phase: Secondary | ICD-10-CM | POA: Diagnosis not present

## 2014-05-26 DIAGNOSIS — R41841 Cognitive communication deficit: Secondary | ICD-10-CM | POA: Diagnosis not present

## 2014-05-26 DIAGNOSIS — Z89519 Acquired absence of unspecified leg below knee: Secondary | ICD-10-CM | POA: Diagnosis not present

## 2014-05-26 DIAGNOSIS — E46 Unspecified protein-calorie malnutrition: Secondary | ICD-10-CM | POA: Diagnosis not present

## 2014-05-28 DIAGNOSIS — R41841 Cognitive communication deficit: Secondary | ICD-10-CM | POA: Diagnosis not present

## 2014-05-28 DIAGNOSIS — R1312 Dysphagia, oropharyngeal phase: Secondary | ICD-10-CM | POA: Diagnosis not present

## 2014-05-28 DIAGNOSIS — E46 Unspecified protein-calorie malnutrition: Secondary | ICD-10-CM | POA: Diagnosis not present

## 2014-05-28 DIAGNOSIS — Z89519 Acquired absence of unspecified leg below knee: Secondary | ICD-10-CM | POA: Diagnosis not present

## 2014-05-31 DIAGNOSIS — Z89519 Acquired absence of unspecified leg below knee: Secondary | ICD-10-CM | POA: Diagnosis not present

## 2014-05-31 DIAGNOSIS — R1312 Dysphagia, oropharyngeal phase: Secondary | ICD-10-CM | POA: Diagnosis not present

## 2014-05-31 DIAGNOSIS — E46 Unspecified protein-calorie malnutrition: Secondary | ICD-10-CM | POA: Diagnosis not present

## 2014-05-31 DIAGNOSIS — R41841 Cognitive communication deficit: Secondary | ICD-10-CM | POA: Diagnosis not present

## 2014-06-07 ENCOUNTER — Encounter: Payer: Self-pay | Admitting: Family

## 2014-06-08 ENCOUNTER — Ambulatory Visit: Payer: Medicare Other | Admitting: Family

## 2014-06-08 ENCOUNTER — Inpatient Hospital Stay (HOSPITAL_COMMUNITY): Admission: RE | Admit: 2014-06-08 | Payer: Medicare Other | Source: Ambulatory Visit

## 2014-06-14 ENCOUNTER — Encounter: Payer: Self-pay | Admitting: Vascular Surgery

## 2014-06-15 ENCOUNTER — Ambulatory Visit (HOSPITAL_COMMUNITY)
Admission: RE | Admit: 2014-06-15 | Discharge: 2014-06-15 | Disposition: A | Discharge status: Home / Self Care | Payer: Medicare Other | Source: Ambulatory Visit | Attending: Vascular Surgery | Admitting: Vascular Surgery

## 2014-06-15 DIAGNOSIS — I739 Peripheral vascular disease, unspecified: Secondary | ICD-10-CM

## 2014-06-17 DIAGNOSIS — E0822 Diabetes mellitus due to underlying condition with diabetic chronic kidney disease: Secondary | ICD-10-CM | POA: Diagnosis not present

## 2014-06-17 DIAGNOSIS — E46 Unspecified protein-calorie malnutrition: Secondary | ICD-10-CM | POA: Diagnosis not present

## 2014-06-17 DIAGNOSIS — I739 Peripheral vascular disease, unspecified: Secondary | ICD-10-CM | POA: Diagnosis not present

## 2014-06-17 DIAGNOSIS — H540 Blindness, both eyes: Secondary | ICD-10-CM | POA: Diagnosis not present

## 2014-06-17 DIAGNOSIS — F324 Major depressive disorder, single episode, in partial remission: Secondary | ICD-10-CM | POA: Diagnosis not present

## 2014-06-21 ENCOUNTER — Encounter: Payer: Self-pay | Admitting: Family

## 2014-06-22 ENCOUNTER — Other Ambulatory Visit: Payer: Self-pay | Admitting: *Deleted

## 2014-06-22 ENCOUNTER — Encounter: Payer: Self-pay | Admitting: Family

## 2014-06-22 ENCOUNTER — Ambulatory Visit (INDEPENDENT_AMBULATORY_CARE_PROVIDER_SITE_OTHER): Payer: Medicare Other | Admitting: Family

## 2014-06-22 VITALS — BP 98/66 | HR 68 | Resp 16 | Ht 70.0 in | Wt 216.0 lb

## 2014-06-22 DIAGNOSIS — I739 Peripheral vascular disease, unspecified: Secondary | ICD-10-CM

## 2014-06-22 DIAGNOSIS — Z95828 Presence of other vascular implants and grafts: Secondary | ICD-10-CM

## 2014-06-22 DIAGNOSIS — Z89522 Acquired absence of left knee: Secondary | ICD-10-CM

## 2014-06-22 DIAGNOSIS — Z9889 Other specified postprocedural states: Secondary | ICD-10-CM | POA: Diagnosis not present

## 2014-06-22 DIAGNOSIS — Z89512 Acquired absence of left leg below knee: Secondary | ICD-10-CM

## 2014-06-22 NOTE — Progress Notes (Signed)
[facility] [place] HISTORY AND PHYSICAL -PAD  History of Present Illness Louis English is a 59 y.o. male patient of Dr. 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  He returns for routine surveillance.  He is a resident of [facility] rehabilitation center.  He is blind, wheelchair bound, staff member from the rehabilitation center is with him. Staff member reports that patient does not have non healing wounds.  Pt Diabetic: Yes Pt smoker: former-smoker  Pt meds include: Statin :Yes Betablocker: Yes ASA: Yes Other anticoagulants/antiplatelets: no    Past Medical History  Diagnosis Date  . Blind in both eyes   . Diabetes mellitus   . Hypertension   . Renal disorder   . Anemia   . GI (gastrointestinal bleed)   . Hyperlipidemia   . 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 Chronic kidney disease, unspecified 12/12/2012  . Anemia in chronic kidney disease(285.21) 12/12/2012  . 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 Pure hypercholesterolemia 07/18/2012  . GERD (gastroesophageal reflux disease) 08/07/2012    Social History History  Substance Use Topics  . Smoking status: Former Smoker -- 15 years  . Smokeless tobacco: Not on file  . Alcohol Use: No    Family History No family history on file.  No past surgical history on file.  No Known Allergies  Current Outpatient Prescriptions  Medication Sig Dispense Refill  . aspirin 81 MG chewable tablet Chew 81 mg by mouth daily.    . cloNIDine (CATAPRES) 0.1 MG tablet Take 0.1 mg by mouth every 6 (six) hours as needed. For blood pressure    . insulin aspart (NOVOLOG) 100 UNIT/ML injection Inject 5-8 Units into the skin 3 (three) times daily with meals. Takes 8 units with  Meals with an additional 5 units for cbg >=150 1 vial 11  . insulin detemir (LEVEMIR) 100 UNIT/ML injection  Inject 0.1-0.45 mLs (10-45 Units total) into the skin at bedtime. Give 10 units in the AM and 45 units in the PM 10 mL 11  . metoCLOPramide (REGLAN) 10 MG tablet Take 10 mg by mouth 4 (four) times daily -  with meals and at bedtime.    . potassium chloride SA (K-DUR,KLOR-CON) 20 MEQ tablet Take 20 mEq by mouth daily.    . simvastatin (ZOCOR) 40 MG tablet Take 40 mg by mouth at bedtime.    . Tamsulosin HCl (FLOMAX) 0.4 MG CAPS Take 0.4 mg by mouth daily.    . traZODone (DESYREL) 50 MG tablet at bedtime as needed and may repeat dose one time if needed.    Marland Kitchen escitalopram (LEXAPRO) 20 MG tablet Take 40 mg by mouth See admin instructions.     . hydrALAZINE (APRESOLINE) 25 MG tablet Take 25 mg by mouth 3 (three) times daily.    . magnesium oxide (MAG-OX) 400 MG tablet Take 400 mg by mouth 2 (two) times daily.    . metoprolol tartrate (LOPRESSOR) 25 MG tablet Take 100 mg by mouth 2 (two) times daily.     . Multiple Vitamins-Minerals (CERTAVITE/ANTIOXIDANTS) TABS Take 1 tablet by mouth daily.    . pantoprazole (PROTONIX) 40 MG tablet Take 1 tablet (40 mg total) by mouth daily. 30 tablet 0  . vitamin C (ASCORBIC ACID) 500 MG tablet Take 500 mg by mouth 2 (two) times daily.     No current facility-administered medications for this  visit.    ROS: See HPI for pertinent positives and negatives.   Physical Examination  Filed Vitals:   06/22/14 1018  BP: 98/66  Pulse: 68  Resp: 16  Height: 5\' 10"  (1.778 m)  Weight: 216 lb (97.977 kg)  SpO2: 94%   Body mass index is 30.99 kg/(m^2).  General: A&O x 3, WDWN, obese male. Gait: in wheelchair Eyes: eyelids closed, squints when speaking Pulmonary: CTAB, without wheezes , rales or rhonchi. Cardiac: regular Rythm , without detected murmur.     Carotid Bruits Left Right   Negative Negative  Aorta is not palpable. Radial pulses: right is 1+, left radial and ulnar pulses not palpable, left brachial pulse is faintly  palpable   VASCULAR EXAM: Extremities without ischemic changes  without Gangrene; without open wounds. Left BKA. Right first through third toes are surgically absent.     LE Pulses LEFT RIGHT   POPLITEAL not palpable  not palpable   POSTERIOR TIBIAL BKA not palpable    DORSALIS PEDIS  ANTERIOR TIBIAL BKA not palpable    Abdomen: soft, NT, no palpable masses. Skin: no rashes, no ulcers. Musculoskeletal: muscle wasting and atrophy of right lower leg. Neurologic: A&O X 3; Appropriate Affect ; SENSATION: normal; MOTOR FUNCTION: moving all extremities equally. Speech is fluent/normal. CN 2-12 grossly intact.          Non-Invasive Vascular Imaging: DATE: 06/15/14 ABI: RIGHT >1.3, Waveforms: triphasic, TBI not performed; left BKA   ASSESSMENT: Louis English is a 59 y.o. 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 Assistant with pt from nursing facility states that pt is in bed all day, no pressure ulcers. Unknown as to why right LE arterial Duplex was not performed, ABI performed 06/15/14. Right ABI is falsely elevated due to calcified vessels but waveforms are triphasic, he has no tissue loss in his right LE. Left BKA.   PLAN:  Based on the patient's vascular studies and examination, pt will return to clinic in 1 year with ABI's and right LE arterial Duplex, staff member and pt advised for pt to return sooner if nonhealing wounds arise.  Physical therapy for strength training and to train nursing staff and pt in daily exercises for pt.  I discussed in depth with the patient the nature of atherosclerosis, and emphasized the importance of maximal medical management including strict control of blood pressure, blood glucose, and lipid levels,  obtaining regular exercise, and continued cessation of smoking.  The patient is aware that without maximal medical management the underlying atherosclerotic disease process will progress, limiting the benefit of any interventions.  The patient was given information about PAD including signs, symptoms, treatment, what symptoms should prompt the patient to seek immediate medical care, and risk reduction measures to take.  Charisse MarchSuzanne Sona Nations, RN, MSN, FNP-C [facility] [place] Office Phone: [phone]  Clinic MD: Hart RochesterLawson  06/22/2014 10:23 AM

## 2014-06-22 NOTE — Patient Instructions (Addendum)

## 2014-07-06 DIAGNOSIS — M6281 Muscle weakness (generalized): Secondary | ICD-10-CM | POA: Diagnosis not present

## 2014-07-06 DIAGNOSIS — Z89519 Acquired absence of unspecified leg below knee: Secondary | ICD-10-CM | POA: Diagnosis not present

## 2014-07-06 DIAGNOSIS — R633 Feeding difficulties: Secondary | ICD-10-CM | POA: Diagnosis not present

## 2014-07-07 DIAGNOSIS — M6281 Muscle weakness (generalized): Secondary | ICD-10-CM | POA: Diagnosis not present

## 2014-07-07 DIAGNOSIS — Z89519 Acquired absence of unspecified leg below knee: Secondary | ICD-10-CM | POA: Diagnosis not present

## 2014-07-07 DIAGNOSIS — R633 Feeding difficulties: Secondary | ICD-10-CM | POA: Diagnosis not present

## 2014-07-08 DIAGNOSIS — Z89519 Acquired absence of unspecified leg below knee: Secondary | ICD-10-CM | POA: Diagnosis not present

## 2014-07-08 DIAGNOSIS — M6281 Muscle weakness (generalized): Secondary | ICD-10-CM | POA: Diagnosis not present

## 2014-07-08 DIAGNOSIS — R633 Feeding difficulties: Secondary | ICD-10-CM | POA: Diagnosis not present

## 2014-07-09 DIAGNOSIS — M6281 Muscle weakness (generalized): Secondary | ICD-10-CM | POA: Diagnosis not present

## 2014-07-09 DIAGNOSIS — R633 Feeding difficulties: Secondary | ICD-10-CM | POA: Diagnosis not present

## 2014-07-09 DIAGNOSIS — Z89519 Acquired absence of unspecified leg below knee: Secondary | ICD-10-CM | POA: Diagnosis not present

## 2014-07-12 DIAGNOSIS — M6281 Muscle weakness (generalized): Secondary | ICD-10-CM | POA: Diagnosis not present

## 2014-07-12 DIAGNOSIS — R633 Feeding difficulties: Secondary | ICD-10-CM | POA: Diagnosis not present

## 2014-07-12 DIAGNOSIS — Z89519 Acquired absence of unspecified leg below knee: Secondary | ICD-10-CM | POA: Diagnosis not present

## 2014-07-13 DIAGNOSIS — M6281 Muscle weakness (generalized): Secondary | ICD-10-CM | POA: Diagnosis not present

## 2014-07-13 DIAGNOSIS — Z89519 Acquired absence of unspecified leg below knee: Secondary | ICD-10-CM | POA: Diagnosis not present

## 2014-07-13 DIAGNOSIS — R633 Feeding difficulties: Secondary | ICD-10-CM | POA: Diagnosis not present

## 2014-07-14 DIAGNOSIS — Z89519 Acquired absence of unspecified leg below knee: Secondary | ICD-10-CM | POA: Diagnosis not present

## 2014-07-14 DIAGNOSIS — R633 Feeding difficulties: Secondary | ICD-10-CM | POA: Diagnosis not present

## 2014-07-14 DIAGNOSIS — M6281 Muscle weakness (generalized): Secondary | ICD-10-CM | POA: Diagnosis not present

## 2014-07-15 DIAGNOSIS — R633 Feeding difficulties: Secondary | ICD-10-CM | POA: Diagnosis not present

## 2014-07-15 DIAGNOSIS — M6281 Muscle weakness (generalized): Secondary | ICD-10-CM | POA: Diagnosis not present

## 2014-07-15 DIAGNOSIS — E119 Type 2 diabetes mellitus without complications: Secondary | ICD-10-CM | POA: Diagnosis not present

## 2014-07-15 DIAGNOSIS — Z89519 Acquired absence of unspecified leg below knee: Secondary | ICD-10-CM | POA: Diagnosis not present

## 2014-07-16 DIAGNOSIS — Z89519 Acquired absence of unspecified leg below knee: Secondary | ICD-10-CM | POA: Diagnosis not present

## 2014-07-16 DIAGNOSIS — R633 Feeding difficulties: Secondary | ICD-10-CM | POA: Diagnosis not present

## 2014-07-16 DIAGNOSIS — M6281 Muscle weakness (generalized): Secondary | ICD-10-CM | POA: Diagnosis not present

## 2014-07-19 DIAGNOSIS — Z79899 Other long term (current) drug therapy: Secondary | ICD-10-CM | POA: Diagnosis not present

## 2014-07-19 DIAGNOSIS — Z89519 Acquired absence of unspecified leg below knee: Secondary | ICD-10-CM | POA: Diagnosis not present

## 2014-07-19 DIAGNOSIS — M6281 Muscle weakness (generalized): Secondary | ICD-10-CM | POA: Diagnosis not present

## 2014-07-19 DIAGNOSIS — D649 Anemia, unspecified: Secondary | ICD-10-CM | POA: Diagnosis not present

## 2014-07-19 DIAGNOSIS — E780 Pure hypercholesterolemia: Secondary | ICD-10-CM | POA: Diagnosis not present

## 2014-07-19 DIAGNOSIS — R633 Feeding difficulties: Secondary | ICD-10-CM | POA: Diagnosis not present

## 2014-07-20 DIAGNOSIS — F419 Anxiety disorder, unspecified: Secondary | ICD-10-CM | POA: Diagnosis not present

## 2014-07-20 DIAGNOSIS — G4700 Insomnia, unspecified: Secondary | ICD-10-CM | POA: Diagnosis not present

## 2014-07-20 DIAGNOSIS — R633 Feeding difficulties: Secondary | ICD-10-CM | POA: Diagnosis not present

## 2014-07-20 DIAGNOSIS — M6281 Muscle weakness (generalized): Secondary | ICD-10-CM | POA: Diagnosis not present

## 2014-07-20 DIAGNOSIS — Z89519 Acquired absence of unspecified leg below knee: Secondary | ICD-10-CM | POA: Diagnosis not present

## 2014-07-20 DIAGNOSIS — F329 Major depressive disorder, single episode, unspecified: Secondary | ICD-10-CM | POA: Diagnosis not present

## 2014-07-21 DIAGNOSIS — R633 Feeding difficulties: Secondary | ICD-10-CM | POA: Diagnosis not present

## 2014-07-21 DIAGNOSIS — Z89519 Acquired absence of unspecified leg below knee: Secondary | ICD-10-CM | POA: Diagnosis not present

## 2014-07-21 DIAGNOSIS — M6281 Muscle weakness (generalized): Secondary | ICD-10-CM | POA: Diagnosis not present

## 2014-07-22 DIAGNOSIS — I739 Peripheral vascular disease, unspecified: Secondary | ICD-10-CM | POA: Diagnosis not present

## 2014-07-22 DIAGNOSIS — Z89519 Acquired absence of unspecified leg below knee: Secondary | ICD-10-CM | POA: Diagnosis not present

## 2014-07-22 DIAGNOSIS — R269 Unspecified abnormalities of gait and mobility: Secondary | ICD-10-CM | POA: Diagnosis not present

## 2014-07-22 DIAGNOSIS — E0822 Diabetes mellitus due to underlying condition with diabetic chronic kidney disease: Secondary | ICD-10-CM | POA: Diagnosis not present

## 2014-07-22 DIAGNOSIS — H547 Unspecified visual loss: Secondary | ICD-10-CM | POA: Diagnosis not present

## 2014-07-22 DIAGNOSIS — Z89512 Acquired absence of left leg below knee: Secondary | ICD-10-CM | POA: Diagnosis not present

## 2014-07-22 DIAGNOSIS — M6281 Muscle weakness (generalized): Secondary | ICD-10-CM | POA: Diagnosis not present

## 2014-07-22 DIAGNOSIS — N183 Chronic kidney disease, stage 3 (moderate): Secondary | ICD-10-CM | POA: Diagnosis not present

## 2014-07-22 DIAGNOSIS — R633 Feeding difficulties: Secondary | ICD-10-CM | POA: Diagnosis not present

## 2014-07-22 DIAGNOSIS — F0390 Unspecified dementia without behavioral disturbance: Secondary | ICD-10-CM | POA: Diagnosis not present

## 2014-07-23 DIAGNOSIS — R633 Feeding difficulties: Secondary | ICD-10-CM | POA: Diagnosis not present

## 2014-07-23 DIAGNOSIS — M6281 Muscle weakness (generalized): Secondary | ICD-10-CM | POA: Diagnosis not present

## 2014-07-23 DIAGNOSIS — Z89519 Acquired absence of unspecified leg below knee: Secondary | ICD-10-CM | POA: Diagnosis not present

## 2014-07-26 DIAGNOSIS — R633 Feeding difficulties: Secondary | ICD-10-CM | POA: Diagnosis not present

## 2014-07-26 DIAGNOSIS — M6281 Muscle weakness (generalized): Secondary | ICD-10-CM | POA: Diagnosis not present

## 2014-07-26 DIAGNOSIS — Z89519 Acquired absence of unspecified leg below knee: Secondary | ICD-10-CM | POA: Diagnosis not present

## 2014-08-19 DIAGNOSIS — Z89429 Acquired absence of other toe(s), unspecified side: Secondary | ICD-10-CM | POA: Diagnosis not present

## 2014-08-19 DIAGNOSIS — E0822 Diabetes mellitus due to underlying condition with diabetic chronic kidney disease: Secondary | ICD-10-CM | POA: Diagnosis not present

## 2014-08-19 DIAGNOSIS — N183 Chronic kidney disease, stage 3 (moderate): Secondary | ICD-10-CM | POA: Diagnosis not present

## 2014-08-19 DIAGNOSIS — Z89512 Acquired absence of left leg below knee: Secondary | ICD-10-CM | POA: Diagnosis not present

## 2014-08-19 DIAGNOSIS — F0390 Unspecified dementia without behavioral disturbance: Secondary | ICD-10-CM | POA: Diagnosis not present

## 2014-08-19 DIAGNOSIS — I739 Peripheral vascular disease, unspecified: Secondary | ICD-10-CM | POA: Diagnosis not present

## 2014-08-19 DIAGNOSIS — F324 Major depressive disorder, single episode, in partial remission: Secondary | ICD-10-CM | POA: Diagnosis not present

## 2014-09-23 DIAGNOSIS — F419 Anxiety disorder, unspecified: Secondary | ICD-10-CM | POA: Diagnosis not present

## 2014-09-23 DIAGNOSIS — F329 Major depressive disorder, single episode, unspecified: Secondary | ICD-10-CM | POA: Diagnosis not present

## 2014-09-23 DIAGNOSIS — G4700 Insomnia, unspecified: Secondary | ICD-10-CM | POA: Diagnosis not present

## 2014-10-14 DIAGNOSIS — I10 Essential (primary) hypertension: Secondary | ICD-10-CM | POA: Diagnosis not present

## 2014-10-14 DIAGNOSIS — K3184 Gastroparesis: Secondary | ICD-10-CM | POA: Diagnosis not present

## 2014-10-14 DIAGNOSIS — E1140 Type 2 diabetes mellitus with diabetic neuropathy, unspecified: Secondary | ICD-10-CM | POA: Diagnosis not present

## 2014-10-14 DIAGNOSIS — F0390 Unspecified dementia without behavioral disturbance: Secondary | ICD-10-CM | POA: Diagnosis not present

## 2014-10-14 DIAGNOSIS — I739 Peripheral vascular disease, unspecified: Secondary | ICD-10-CM | POA: Diagnosis not present

## 2014-10-14 DIAGNOSIS — Z89512 Acquired absence of left leg below knee: Secondary | ICD-10-CM | POA: Diagnosis not present

## 2014-10-14 DIAGNOSIS — N183 Chronic kidney disease, stage 3 (moderate): Secondary | ICD-10-CM | POA: Diagnosis not present

## 2014-10-14 DIAGNOSIS — F322 Major depressive disorder, single episode, severe without psychotic features: Secondary | ICD-10-CM | POA: Diagnosis not present

## 2015-06-17 ENCOUNTER — Encounter: Payer: Self-pay | Admitting: Family

## 2015-06-28 ENCOUNTER — Ambulatory Visit (HOSPITAL_COMMUNITY)
Admission: RE | Admit: 2015-06-28 | Discharge: 2015-06-28 | Disposition: A | Discharge status: Home / Self Care | Payer: Medicare Other | Source: Ambulatory Visit | Attending: Family | Admitting: Family

## 2015-06-28 ENCOUNTER — Encounter: Payer: Self-pay | Admitting: Family

## 2015-06-28 ENCOUNTER — Ambulatory Visit (INDEPENDENT_AMBULATORY_CARE_PROVIDER_SITE_OTHER): Payer: Medicare Other | Admitting: Family

## 2015-06-28 VITALS — BP 110/72 | HR 69 | Temp 98.3°F | Ht 70.0 in | Wt 216.0 lb

## 2015-06-28 DIAGNOSIS — Z89522 Acquired absence of left knee: Secondary | ICD-10-CM | POA: Diagnosis not present

## 2015-06-28 DIAGNOSIS — Z95828 Presence of other vascular implants and grafts: Secondary | ICD-10-CM | POA: Diagnosis not present

## 2015-06-28 DIAGNOSIS — I739 Peripheral vascular disease, unspecified: Secondary | ICD-10-CM

## 2015-06-28 DIAGNOSIS — E1151 Type 2 diabetes mellitus with diabetic peripheral angiopathy without gangrene: Secondary | ICD-10-CM

## 2015-06-28 DIAGNOSIS — Z89512 Acquired absence of left leg below knee: Secondary | ICD-10-CM

## 2015-06-28 NOTE — Progress Notes (Signed)
[facility] [place] HISTORY AND PHYSICAL -PAD  History of Present Illness Louis English is a 60 y.o. male patient of Dr. 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  He returns for routine surveillance, but non invasive vascular testing was not done today as the staff were unable to lift him to the exam table.  He is a resident of [facility].  He is blind, wheelchair bound, staff member from the rehabilitation center is with him. Staff member reports that patient does not have pressure ulcers.  Pt Diabetic: Yes, states a recent blood sugar reading by the NH staff was 400 Pt smoker: former-smoker  Pt meds include: Statin :Yes Betablocker: Yes ASA: Yes Other anticoagulants/antiplatelets: no      Past Medical History  Diagnosis Date  . Blind in both eyes     Blind for 8 yrs. (2016)  . Diabetes mellitus   . Hypertension   . Renal disorder   . Anemia   . GI (gastrointestinal bleed)   . Hyperlipidemia   . 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 Chronic kidney disease, unspecified (HCC) 12/12/2012  . Anemia in chronic kidney disease(285.21) 12/12/2012  . 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 Pure hypercholesterolemia 07/18/2012  . GERD (gastroesophageal reflux disease) 08/07/2012    Social History Social History  Substance Use Topics  . Smoking status: Former Smoker -- 15 years    Quit date: 06/21/1997  . Smokeless tobacco: Never Used  . Alcohol Use: No    Family History No family history on file.  No past surgical history on file.  No Known Allergies  Current Outpatient Prescriptions  Medication Sig Dispense Refill  . aspirin 81 MG chewable tablet Chew 81 mg by mouth daily.    . Cholecalciferol 2000 units CAPS Take 1 capsule by mouth daily.    . cloNIDine (CATAPRES) 0.1 MG tablet Take  0.1 mg by mouth every 6 (six) hours as needed. Reported on 06/28/2015    . escitalopram (LEXAPRO) 20 MG tablet Take 40 mg by mouth See admin instructions.     . hydrALAZINE (APRESOLINE) 25 MG tablet Take 25 mg by mouth 3 (three) times daily.    . insulin detemir (LEVEMIR) 100 UNIT/ML injection Inject 0.1-0.45 mLs (10-45 Units total) into the skin at bedtime. Give 10 units in the AM and 45 units in the PM 10 mL 11  . insulin lispro (HUMALOG) 100 UNIT/ML injection Inject 100 Units into the skin 3 (three) times daily before meals.    . magnesium oxide (MAG-OX) 400 MG tablet Take 400 mg by mouth 2 (two) times daily.    . metoCLOPramide (REGLAN) 10 MG tablet Take 10 mg by mouth 4 (four) times daily -  with meals and at bedtime.    . metoprolol tartrate (LOPRESSOR) 25 MG tablet Take 100 mg by mouth 2 (two) times daily.     . Multiple Vitamins-Minerals (CERTAVITE/ANTIOXIDANTS) TABS Take 1 tablet by mouth daily.    . Polyethylene Glycol 3350 (MIRALAX PO) Take by mouth.    . potassium chloride SA (K-DUR,KLOR-CON) 20 MEQ tablet Take 20 mEq by mouth daily.    . simvastatin (ZOCOR) 40 MG tablet Take 40 mg by mouth at bedtime.    . Tamsulosin HCl (FLOMAX) 0.4 MG CAPS Take 0.4 mg by mouth daily.    . traZODone (DESYREL) 50 MG tablet  at bedtime as needed and may repeat dose one time if needed.    . insulin aspart (NOVOLOG) 100 UNIT/ML injection Inject 5-8 Units into the skin 3 (three) times daily with meals. Takes 8 units with  Meals with an additional 5 units for cbg >=150 (Patient not taking: Reported on 06/28/2015) 1 vial 11  . pantoprazole (PROTONIX) 40 MG tablet Take 1 tablet (40 mg total) by mouth daily. 30 tablet 0  . vitamin C (ASCORBIC ACID) 500 MG tablet Take 500 mg by mouth 2 (two) times daily. Reported on 06/28/2015     No current facility-administered medications for this visit.    ROS: See HPI for pertinent positives and negatives.   Physical Examination  Filed Vitals:   06/28/15 0925  BP:  110/72  Pulse: 69  Temp: 98.3 F (36.8 C)  TempSrc: Oral  Height: 5\' 10"  (1.778 m)  Weight: 216 lb (97.977 kg)  SpO2: 94%   Body mass index is 30.99 kg/(m^2).  General: A&O x 3, WDWN, obese male. Gait: seated in wheelchair Eyes: eyelids closed, squints when speaking Pulmonary: Respirations are non labored, CTAB, without wheezes , rales or rhonchi. Cardiac: regular rhythm, no detected murmur.     Carotid Bruits Left Right   Negative Negative  Aorta is not palpable. Radial pulses: right is 1+, left radial and ulnar pulses not palpable, left brachial pulse is faintly palpable   VASCULAR EXAM: Extremities without ischemic changes  without Gangrene; without open wounds. Left BKA. Right first through third toes are surgically absent.     LE Pulses LEFT RIGHT   POPLITEAL not palpable  not palpable   POSTERIOR TIBIAL BKA not palpable    DORSALIS PEDIS  ANTERIOR TIBIAL BKA not palpable    Abdomen: soft, NT, no palpable masses. Skin: no rashes, no ulcers. Musculoskeletal: muscle wasting and atrophy of right lower leg. Neurologic: A&O X 3; Appropriate Affect ; SENSATION: normal; MOTOR FUNCTION: moving all extremities equally. Speech is fluent/normal. CN 2-12 grossly intact.                Non-Invasive Vascular Imaging: DATE: 06/28/2015 None, see HPI  ASSESSMENT: Louis English is a 60 y.o. 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 Assistant with pt from nursing facility states that pt is in bed all day, no pressure ulcers.  Right ABI has been falsely elevated due to calcified vessels. Discussed pt HPI and previous results with Dr. Arbie CookeyEarly. Will see pt on an as needed basis in the future; that is, if he  develops pain in his right lower extremity or non healing wounds in his right LE.  PLAN:  Based on the patient's HPI and previous vascular studies, and after discussing with Dr. Arbie CookeyEarly, pt will return prn  I discussed in depth with the patient the nature of atherosclerosis, and emphasized the importance of maximal medical management including strict control of blood pressure, blood glucose, and lipid levels, obtaining regular exercise, and continued cessation of smoking.  The patient is aware that without maximal medical management the underlying atherosclerotic disease process will progress, limiting the benefit of any interventions.  The patient was given information about PAD including signs, symptoms, treatment, what symptoms should prompt the patient to seek immediate medical care, and risk reduction measures to take.  Charisse MarchSuzanne Nickel, RN, MSN, FNP-C [facility] [place] Office Phone: [phone]  Clinic MD: Hart RochesterLawson  06/28/2015 9:43 AM
